# Patient Record
Sex: Female | Born: 1945
Health system: Southern US, Community
[De-identification: ages and names within clinical notes are randomized; demographics above are authoritative.]

## PROBLEM LIST (undated history)

## (undated) DIAGNOSIS — IMO0002 Reserved for concepts with insufficient information to code with codable children: Secondary | ICD-10-CM

## (undated) DIAGNOSIS — I10 Essential (primary) hypertension: Secondary | ICD-10-CM

## (undated) DIAGNOSIS — B029 Zoster without complications: Secondary | ICD-10-CM

## (undated) DIAGNOSIS — H18529 Epithelial (juvenile) corneal dystrophy, unspecified eye: Secondary | ICD-10-CM

## (undated) DIAGNOSIS — M8580 Other specified disorders of bone density and structure, unspecified site: Secondary | ICD-10-CM

## (undated) DIAGNOSIS — H1852 Epithelial (juvenile) corneal dystrophy: Secondary | ICD-10-CM

## (undated) DIAGNOSIS — K5792 Diverticulitis of intestine, part unspecified, without perforation or abscess without bleeding: Secondary | ICD-10-CM

## (undated) DIAGNOSIS — E785 Hyperlipidemia, unspecified: Secondary | ICD-10-CM

## (undated) DIAGNOSIS — L439 Lichen planus, unspecified: Secondary | ICD-10-CM

## (undated) DIAGNOSIS — K51411 Inflammatory polyps of colon with rectal bleeding: Secondary | ICD-10-CM

## (undated) DIAGNOSIS — I341 Nonrheumatic mitral (valve) prolapse: Secondary | ICD-10-CM

## (undated) DIAGNOSIS — E538 Deficiency of other specified B group vitamins: Secondary | ICD-10-CM

## (undated) DIAGNOSIS — S4292XA Fracture of left shoulder girdle, part unspecified, initial encounter for closed fracture: Secondary | ICD-10-CM

## (undated) HISTORY — DX: Nonrheumatic mitral (valve) prolapse: I34.1

## (undated) HISTORY — DX: Diverticulitis of intestine, part unspecified, without perforation or abscess without bleeding: K57.92

## (undated) HISTORY — DX: Zoster without complications: B02.9

## (undated) HISTORY — DX: Lichen planus, unspecified: L43.9

## (undated) HISTORY — DX: Deficiency of other specified B group vitamins: E53.8

## (undated) HISTORY — DX: Essential (primary) hypertension: I10

## (undated) HISTORY — DX: Other specified disorders of bone density and structure, unspecified site: M85.80

## (undated) HISTORY — DX: Epithelial (juvenile) corneal dystrophy: H18.52

## (undated) HISTORY — PX: NOSE SURGERY: SHX723

## (undated) HISTORY — DX: Hyperlipidemia, unspecified: E78.5

## (undated) HISTORY — DX: Reserved for concepts with insufficient information to code with codable children: IMO0002

## (undated) HISTORY — PX: TONSILLECTOMY AND ADENOIDECTOMY: SHX28

## (undated) HISTORY — DX: Fracture of left shoulder girdle, part unspecified, initial encounter for closed fracture: S42.92XA

## (undated) HISTORY — DX: Inflammatory polyps of colon with rectal bleeding: K51.411

## (undated) HISTORY — DX: Epithelial (juvenile) corneal dystrophy, unspecified eye: H18.529

---

## 1992-06-07 DIAGNOSIS — B029 Zoster without complications: Secondary | ICD-10-CM

## 1992-06-07 HISTORY — DX: Zoster without complications: B02.9

## 1993-01-07 DIAGNOSIS — R87619 Unspecified abnormal cytological findings in specimens from cervix uteri: Secondary | ICD-10-CM

## 1993-01-07 DIAGNOSIS — IMO0002 Reserved for concepts with insufficient information to code with codable children: Secondary | ICD-10-CM

## 1993-01-07 HISTORY — DX: Reserved for concepts with insufficient information to code with codable children: IMO0002

## 1993-01-07 HISTORY — DX: Unspecified abnormal cytological findings in specimens from cervix uteri: R87.619

## 1994-12-08 DIAGNOSIS — K51411 Inflammatory polyps of colon with rectal bleeding: Secondary | ICD-10-CM

## 1994-12-08 HISTORY — DX: Inflammatory polyps of colon with rectal bleeding: K51.411

## 1996-01-08 HISTORY — PX: SHOULDER ARTHROSCOPY: SHX128

## 1998-03-31 ENCOUNTER — Other Ambulatory Visit: Admission: RE | Admit: 1998-03-31 | Discharge: 1998-03-31 | Payer: Self-pay | Admitting: Obstetrics and Gynecology

## 1999-03-02 ENCOUNTER — Other Ambulatory Visit: Admission: RE | Admit: 1999-03-02 | Discharge: 1999-03-02 | Payer: Self-pay | Admitting: Obstetrics and Gynecology

## 1999-06-22 ENCOUNTER — Ambulatory Visit (HOSPITAL_COMMUNITY): Admission: RE | Admit: 1999-06-22 | Discharge: 1999-06-22 | Payer: Self-pay | Admitting: Gastroenterology

## 2000-02-28 ENCOUNTER — Other Ambulatory Visit: Admission: RE | Admit: 2000-02-28 | Discharge: 2000-02-28 | Payer: Self-pay | Admitting: Obstetrics and Gynecology

## 2000-10-13 ENCOUNTER — Encounter: Payer: Self-pay | Admitting: Obstetrics and Gynecology

## 2000-10-13 ENCOUNTER — Encounter: Admission: RE | Admit: 2000-10-13 | Discharge: 2000-10-13 | Payer: Self-pay | Admitting: Obstetrics and Gynecology

## 2001-03-24 ENCOUNTER — Other Ambulatory Visit: Admission: RE | Admit: 2001-03-24 | Discharge: 2001-03-24 | Payer: Self-pay | Admitting: Obstetrics and Gynecology

## 2001-11-16 ENCOUNTER — Encounter: Admission: RE | Admit: 2001-11-16 | Discharge: 2001-11-16 | Payer: Self-pay | Admitting: Orthopaedic Surgery

## 2001-11-16 ENCOUNTER — Encounter: Payer: Self-pay | Admitting: Orthopaedic Surgery

## 2002-02-01 ENCOUNTER — Emergency Department (HOSPITAL_COMMUNITY): Admission: EM | Admit: 2002-02-01 | Discharge: 2002-02-01 | Payer: Self-pay | Admitting: Emergency Medicine

## 2002-03-25 ENCOUNTER — Other Ambulatory Visit: Admission: RE | Admit: 2002-03-25 | Discharge: 2002-03-25 | Payer: Self-pay | Admitting: Obstetrics and Gynecology

## 2002-08-09 ENCOUNTER — Encounter: Payer: Self-pay | Admitting: Orthopaedic Surgery

## 2002-08-09 ENCOUNTER — Encounter: Admission: RE | Admit: 2002-08-09 | Discharge: 2002-08-09 | Payer: Self-pay | Admitting: Orthopaedic Surgery

## 2003-01-08 HISTORY — PX: TOTAL HIP ARTHROPLASTY: SHX124

## 2003-03-28 ENCOUNTER — Other Ambulatory Visit: Admission: RE | Admit: 2003-03-28 | Discharge: 2003-03-28 | Payer: Self-pay | Admitting: Obstetrics and Gynecology

## 2003-06-07 ENCOUNTER — Inpatient Hospital Stay (HOSPITAL_COMMUNITY): Admission: RE | Admit: 2003-06-07 | Discharge: 2003-06-10 | Payer: Self-pay | Admitting: Orthopaedic Surgery

## 2004-05-31 ENCOUNTER — Other Ambulatory Visit: Admission: RE | Admit: 2004-05-31 | Discharge: 2004-05-31 | Payer: Self-pay | Admitting: Obstetrics and Gynecology

## 2006-09-01 ENCOUNTER — Other Ambulatory Visit: Admission: RE | Admit: 2006-09-01 | Discharge: 2006-09-01 | Payer: Self-pay | Admitting: Obstetrics & Gynecology

## 2007-09-10 ENCOUNTER — Other Ambulatory Visit: Admission: RE | Admit: 2007-09-10 | Discharge: 2007-09-10 | Payer: Self-pay | Admitting: Obstetrics & Gynecology

## 2010-05-25 NOTE — H&P (Signed)
[name], [name]                         ACCOUNT NO.:  [id number]   MEDICAL RECORD NO.:  [id number]                   PATIENT TYPE:  INP   LOCATION:  NA                                   FACILITY:  [facility]   PHYSICIAN:  Claude Manges. Cleophas Dunker, M.D.            DATE OF BIRTH:  08/28/1945   DATE OF ADMISSION:  06/07/2003  DATE OF DISCHARGE:                                HISTORY & PHYSICAL   CHIEF COMPLAINT:  Left hip pain.   HISTORY OF PRESENT ILLNESS:  The patient is a 65 year old white female with  several-year progressively worsening left hip and back pain.  The patient  states that over the last year it has significantly worsened.  She has pain  at all times.  She says it ranges from sharp/dull to burning.  It does  worsen with any type of weightbearing activity and laying down.  She does  have night pain.  The pain is located in the posterior aspect of the hip  radiating around into the anterior groin and then down the leg to the knee.  She denies any specific mechanical symptoms.  She is currently having some  posterior back pain across the lower lumbar and iliac crest region.   X-rays show severe osteoarthritis with destruction of the hip, bone-on-bone.   ALLERGIES:  SULFA.   CURRENT MEDICATIONS:  1. Mobic 7.5 mg p.o. b.i.d. alternated with ibuprofen 600 mg p.o. b.i.d.     p.r.n.  2. Claritin 10 mg p.o. daily.   PAST MEDICAL HISTORY:  The patient denies any significant medical history  other than mitral valve prolapse and seasonal allergies.   PAST SURGICAL HISTORY:  1. Tonsillectomy.  2. Deviated septum.  3. Shoulder manipulation.   The patient denies any medial issues or complications to the above-mentioned  surgical procedures.   SOCIAL HISTORY:  This is a healthy-appearing, well-developed, thin 58-year-  old white female.  Denies any history of smoking.  She has an occasional  alcoholic beverage.  She is divorced.  She lives in a two-story house.  She  is a  Chiropractor.  Primary care physician is Dr. Talmadge Coventry,  [phone].   FAMILY MEDICAL HISTORY:  Both mother and father are deceased from lung  cancer.  The patient has got four brothers alive with a history of ETOH  abuse, otherwise in good medical health.  Four sisters alive with no medical  issues.   REVIEW OF SYSTEMS:  Positive for glasses at all times.  She does have  occasional problems with increased urinary urgency and frequency, otherwise  all other categories of the review of system categories are negative at this  time.   PHYSICAL EXAMINATION:  VITAL SIGNS:  Height is 5 feet 5, weight is 135  pounds, blood pressure is 142/78, pulse of 80 and regular, respirations 12.  The patient is afebrile.  GENERAL:  This is an elderly-appearing, thin-stature, well-developed  white  female.  She does walk with a fairly balanced gait but she is able to get  herself on and off the exam table and in and out of the chair without much  difficulty.  HEENT:  Head was normocephalic.  Pupils equal, round, and reactive.  Extraocular movements intact.  Sclera is not icteric.  External ears were  without deformities.  Gross hearing is intact.  Nasal septum was midline.  Oral buccal mucosa was pink and moist.  NECK:  Supple.  No palpable lymphadenopathy.  Thyroid region nontender.  Good range of motion.  CHEST:  Lung sounds were clear and equal bilaterally.  HEART:  Regular rate and rhythm.  No murmurs.  ABDOMEN:  Soft, nontender.  Bowel sounds were normoactive.  She had no  hepatosplenomegaly.  CVA region was nontender.  EXTREMITIES:  Upper extremities were symmetrical in size and shape.  She had  excellent range of motion to her shoulders, elbows, and wrists.  Motor  strength was 5/5.  Lower extremities:  The patient was slightly tender with  percussion along the lumbar spine and iliac crest region.  Left hip had full  extension/flexion up to 120 degrees.  She had 0 degrees internal  rotation,  10 degrees external rotation limited by mechanical and discomfort.  Right  hip had full extension, flexion up to 120 degrees, 20 degrees  internal/external rotation without mechanical symptoms or discomfort.  Bilateral knees had normal exam, as were the ankles.  PERIPHERAL VASCULAR:  Carotid pulses were 2+, no bruits.  Radial pulses were  2+.  Dorsalis pedis pulses were 2+.  She had no extremity edema or  venostasis changes.  NEUROLOGIC:  The patient was conscious, alert, and appropriate.  Easy  conversation with examiner.  Cranial nerves II-XII were grossly intact.  The  patient was  grossly intact to light touch sensation from head to toe.  She  had no gross neurologic defects noted.  BREAST/RECTAL/GENITOURINARY:  Exams were deferred at this time.   IMPRESSION:  1. End-stage osteoarthritis of the left hip.  2. Lower back pain.  3. Seasonal allergies.  4. History of mitral valve prolapse.   PLAN:  The patient will undergo all routine labs and tests prior to having a  left total hip arthroplasty by Dr. Norlene Campbell at [facility] on  May 31.  The patient has donated 2 units of autologous blood for this  procedure.      [name], P.A.                      Claude Manges. Cleophas Dunker, M.D.    RWK/MEDQ  D:  05/23/2003  T:  05/23/2003  Job:  [id number]

## 2010-05-25 NOTE — Discharge Summary (Signed)
NAMECRISTLE, [name]                         ACCOUNT NO.:  [id number]   MEDICAL RECORD NO.:  [id number]                   PATIENT TYPE:  INP   LOCATION:  5015                                 FACILITY:  [facility]   PHYSICIAN:  Claude Manges. Cleophas Dunker, M.D.            DATE OF BIRTH:  Aug 14, 1945   DATE OF ADMISSION:  06/07/2003  DATE OF DISCHARGE:  06/10/2003                                 DISCHARGE SUMMARY   ADMISSION DIAGNOSIS:  Severe osteoarthritis of left hip.   DISCHARGE DIAGNOSES:  1. Left total hip arthroplasty.  2. Postoperative blood loss anemia.  3. Anxiety postoperatively, resolved.   HISTORY OF PRESENT ILLNESS:  The patient is a 65 year old female with  several year history of progressively worsening left hip and lower back  pain. The patient states over the last year that the pain is significantly  worse and she has pain at all times. It ranges from sharp to dull to  burning. Worsened with any weight bearing activity and laying down. It does  bother her at night. The pain is located in the posterior aspect of the hip  radiating down the anterior groin and down into the knee. She denies any  mechanical symptoms. X-rays show severe osteoarthritis with destruction of  the hip and bone on bone.   ALLERGIES:  SULFA.   CURRENT MEDICATIONS:  1. Mobic 7.5 mg p.o. b.i.d.  2. Ibuprofen p.r.n.  3. Claritin 10 mg p.o. q.d.   SURGICAL PROCEDURE:  On Jun 07, 2003, the patient was taken to the OR where  a left hip total arthroplasty was performed by Dr. Norlene Campbell assisted  by Dr. Chaney Malling and Rexene Edison, P.A.-C. The patient had the procedure done  under epidural catheter. She did have an AML small stature 155 length  femoral stem, a size 52 acetabular cup, a large lipped 52-mm outside  diameter/32-mm inside diameter ___________ component, and a size 32-mm +1  off set femoral head. The patient tolerated the procedure well. There were  no complications. The patient was transferred  from the recovery room and to  the orthopedic floor in good condition where the patient was placed on  Coumadin for routine DVT prophylaxis.   The patient then incurred a total of three days postoperative care on the  orthopedic floor in which the patient did develop some slight postoperative  blood loss anemia, lightheadedness, dizziness. She was transfused autologous  blood with good improvement and resolution of her symptoms without any  complications. The patient also had some initial first day postoperative  anxiety. This was well controlled with low dose Xanax, and she had no  further issues with this throughout her hospitalization. The worked well  with physical therapy. Her vital signs remained stable. Her leg remained  neuromotor vascularly intact. Her wound remained benign. The patient was  eager to be discharged to home, and she was orthopedically and medically  stable, so she was  discharged to home on postoperative day #3 for routine  outpatient physical therapy. The patient's pain was well controlled with  Mepergan fortis upon discharge to home.   LABORATORY DATA:  EKG on admission showed normal sinus rhythm with possible  anterior septal infarct, age indeterminate at 62 beats per minute. CBC on  June 3:  WBCs 10.1, hemoglobin 9.9, hematocrit 21.1, and platelets 221. INR  on discharge was 1.5. Routine chemistries on June 2:  Sodium 137, potassium  3.5, glucose 115 down from a high of 126, BUN 5, creatinine 0.7, calcium  8.0. Routine urinalysis on admission was normal with exception of a few  epithelial cells.   The patient received 2 units of autologous blood.   MEDICATIONS UPON DISCHARGE FROM ORTHOPEDIC FLOOR:  1. Colace 100 mg p.o. b.i.d.  2. Trinsicon 1 tablet p.o. t.i.d.  3. Laxative or enema of choice p.r.n.  4. Heparin 5,000 units subcu q.12h. until Coumadin therapeutic.  5. Mepergan Fortes 1 to 2 tablets every 4 to 6 hours for pain p.r.n.  6. Tylenol 650 mg p.o.  q.6h. p.r.n.  7. Skelaxin 1 or 2 tablets every 4 to 6 hours p.r.n.  8. Restoril 15 mg p.o. q.h.s.  9. Claritin 10 mg p.o. q.d.  10.      Coumadin 5 mg p.o. q.d.   DISCHARGE INSTRUCTIONS:   MEDICATIONS:  1. Coumadin. The patient should take 5 mg half of 1 tablet a day unless     changed by home health pharmacist.  2. [name] take 1 or 2 tablets every 4 to 6 hours for pain if     needed.  3. Xanax 0.25 mg 1 tablet every 6 hours for nerves as needed.   ACTIVITY:  The patient may be partial weight bearing on the left hip with  the use of a walker and closely following hip precautions.   DIET:  No restrictions.   WOUND CARE:  The patient is to keep wound clean and dry. Check daily for any  signs of infection. If any issues, the patient is to call Dr. Hoy Register  office for advice.   FOLLOW UP:  The patient should have a followup appointment with Dr.  Cleophas Dunker approximately 10 days from discharge. The patient is to call for  this followup appointment.   CONDITION ON DISCHARGE:  The patient's condition on discharge to home is  listed as improved and good.      [name], P.A.                      Claude Manges. Cleophas Dunker, M.D.    RWK/MEDQ  D:  07/07/2003  T:  07/07/2003  Job:  [id number]

## 2010-05-25 NOTE — Procedures (Signed)
[place]  Patient:    [name], [name]                      MRN: [id number] Proc. Date: 06/22/99 Adm. Date:  [id number] Disc. Date: [id number] Attending:  Orland Mustard CC:         Dellis Anes. Idell Pickles, M.D.                           Procedure Report  PROCEDURE PERFORMED:  Colonoscopy.  ENDOSCOPIST:  Llana Aliment. Randa Evens, M.D.  MEDICATIONS USED:  Fentanyl 100 mcg, Versed 10 mg IV.  INSTRUMENT:  Adult Olympus video colonoscope.  INDICATIONS:  The patient is a 65 year old woman who had removal of a large rectal polyp with severe dysplasia.  Had a completely negative colonoscopy three years ago other than internal hemorrhoids.  This is done as a three-year follow-up.  DESCRIPTION OF PROCEDURE:  The procedure had been explained to the patient and consent obtained.  With the patient in the left lateral decubitus position, the Olympus adult video colonoscope was inserted and advanced under direct visualization.  The prep was excellent and we were able to advance to the cecum without difficulty.  The scope was withdrawn.  The cecum, ascending colon, hepatic flexure, transverse colon, splenic flexure, descending and sigmoid colon were seen well.  No polyps seen throughout the entire colon, no significant diverticular disease.  Small internal hemorrhoids seen in the rectum upon removal of the scope.  Scope withdrawn, patient tolerated the procedure well.  Maintained on low flow oxygen and pulse oximeter throughout the procedure with no obvious problem.  ASSESSMENT: 1. No evidence of colon polyps. 2. Internal hemorrhoids.  PLAN:  Will recommend repeating in five years with yearly hemoccults. DD:  06/22/99 TD:  06/26/99 Job: [id number]

## 2010-05-25 NOTE — Op Note (Signed)
[name], [name]                         ACCOUNT NO.:  [id number]   MEDICAL RECORD NO.:  [id number]                   PATIENT TYPE:  INP   LOCATION:  2899                                 FACILITY:  [facility]   PHYSICIAN:  Claude Manges. Cleophas Dunker, M.D.            DATE OF BIRTH:  [birth year]   DATE OF PROCEDURE:  06/07/2003  DATE OF DISCHARGE:                                 OPERATIVE REPORT   PREOPERATIVE DIAGNOSIS:  End stage osteoarthritis of the left hip.   POSTOPERATIVE DIAGNOSIS:  End stage osteoarthritis of the left hip.   OPERATION PERFORMED:  Left total hip replacement.   SURGEON:  Claude Manges. Cleophas Dunker, M.D.   ASSISTANT:  1. Lenard Galloway. Chaney Malling, M.D.  2. Richardean Canal, P.A.   ANESTHESIA:  Epidural/spinal.   COMPLICATIONS:  None.   COMPONENTS:  Depuy AML small stature 12 femoral component, 155 mm length, 40  mm offset.  A 32 mm outer diameter hip ball with a +1 neck, 52 mm outer  diameter Pinnacle 100 series acetabular cup with a 10 degree polyethylene  liner and an apex hole eliminator.   DESCRIPTION OF PROCEDURE:  With the patient comfortable on the operating  room and under spinal anesthetic, the nursing staff inserted a Foley  catheter.  With excellent anesthesia, the patient was then placed in the  lateral decubitus position with the left side up.  The left side was  appropriately identified as the operative site prior to the patient coming  to the operating room.  The patient was secured to the operating room table  with the Innomed hip system.  The left hip was then prepped with Betadine  scrub and then DuraPrep from the iliac crest to the  midfoot.  Sterile  draping was performed.  A routine Southern incision was utilized and by  sharp dissection, carried down to the subcutaneous tissue.  Adipose tissue  was carefully incised to the level of the iliotibial band.  Self-retaining  retractors were inserted.  The iliotibial band was then incised along the  length of  the skin incision.  With the hip internally rotated, the short  external rotators were identified and carefully incised.  Along the femoral  neck, tendinous structures were tagged with 0 Tycron suture.  The hip  capsule was then identified and carefully incised on the femoral neck and  head.  There was less than 5 mL of clear yellow joint effusion and a  moderate amount of synovitis which exuded from the capsule incision.  The  hip was then dislocated posteriorly.  The Charnley retractor was inserted.  Using the AML guide, the femoral neck was then osteotomized with an  oscillating saw.  The femoral head was delivered from the acetabulum.  The  head was devoid of articular cartilage and there were osteophytes as well as  synovitis.   Acetabulum was then explored.  There was a moderate amount of synovitis  which was debrided with the rongeur. The femur was then prepared for the  prosthesis.  A starter hole was then made in the piriformis fossa.  A canal  finder was then inserted.  Reaming was performed to 11.5 mm to accept a 12  mm prosthesis.  Sequential broaching of the femoral canal was then performed  with a 10 and then a 12 mm femoral component medial modified aspect.  The  calcar reamer was used to obtain appropriate position at about 15 to 20  degrees of anteversion.  The resection was about 1 cm proximal to the lesser  trochanter.  The acetabular retractor was then inserted.  The labrum was  sharply excised.  Reaming was performed to 51 mm outer diameter to accept a  52 mm prosthesis.  There was nice bleeding throughout the acetabulum.  We  trialed a 50 and then a 52 trial.  We felt that a 52 would be an excellent  fit.  The 52 mm outer diameter, 100 series acetabular component was then  impacted, flush with the acetabular guide.  The trial polyethylene component  was then inserted followed by the trial broach and a #1 neck length, 32 mm  outer diameter hip ball.  This was reduced,  we had excellent stability with  internal, external rotation. There was no subluxation and I felt that we had  re-established leg length equality which was about 3/8 inch shorter on the  left than the right.  The trial components were then removed.  The wound was  copiously irrigated with saline solution.  The Apex hole eliminator was  inserted followed by the final polyethylene component.  The femoral canal  was irritated with saline solution.  The femoral component was placed flush  on the calcar in about 15 to 20 degrees of anteversion.  The 32 mm outer  diameter +1 neck length hip ball was then impacted.  The entire construct  was reduced after checking the acetabulum for any loose debris.  The leg was  then placed through a full range of motion. There was no subluxation in  extension or flexion.  There was no toggling and again, I felt that the leg  lengths had been re-established.   The wound was again irrigated with saline solution.  The capsule was closed  anatomically with a #1 Ethibond.  Short external rotators were closed with  the same material.  The iliotibial band closed with running 0 Vicryl, the  subcu closed in several layers with 0 and 2-0 Vicryl.  The skin closed with  running 3-0 subcuticular Prolene with Steri-Strips.  Sterile bulky dressing  was applied followed by the Ioban.  The patient tolerated the procedure  without complications.                                               Claude Manges. Cleophas Dunker, M.D.    PWW/MEDQ  D:  06/07/2003  T:  06/07/2003  Job:  [id number]

## 2010-09-20 ENCOUNTER — Other Ambulatory Visit: Payer: Self-pay | Admitting: Gastroenterology

## 2010-09-21 ENCOUNTER — Ambulatory Visit
Admission: RE | Admit: 2010-09-21 | Discharge: 2010-09-21 | Disposition: A | Discharge status: Home / Self Care | Payer: PRIVATE HEALTH INSURANCE | Source: Ambulatory Visit | Attending: Gastroenterology | Admitting: Gastroenterology

## 2010-09-21 MED ORDER — IOHEXOL 300 MG/ML  SOLN
100.0000 mL | Freq: Once | INTRAMUSCULAR | Status: AC | PRN
  Administered 2010-09-21: 100 mL via INTRAVENOUS

## 2012-04-01 ENCOUNTER — Ambulatory Visit (INDEPENDENT_AMBULATORY_CARE_PROVIDER_SITE_OTHER): Payer: No Typology Code available for payment source | Admitting: Obstetrics and Gynecology

## 2012-04-01 ENCOUNTER — Encounter: Payer: Self-pay | Admitting: Obstetrics and Gynecology

## 2012-04-01 VITALS — BP 128/70 | Ht 65.0 in | Wt 131.0 lb

## 2012-04-01 DIAGNOSIS — IMO0002 Reserved for concepts with insufficient information to code with codable children: Secondary | ICD-10-CM | POA: Insufficient documentation

## 2012-04-01 DIAGNOSIS — N816 Rectocele: Secondary | ICD-10-CM | POA: Insufficient documentation

## 2012-04-01 DIAGNOSIS — N8110 Cystocele, unspecified: Secondary | ICD-10-CM | POA: Insufficient documentation

## 2012-04-01 DIAGNOSIS — N8111 Cystocele, midline: Secondary | ICD-10-CM

## 2012-04-01 DIAGNOSIS — N814 Uterovaginal prolapse, unspecified: Secondary | ICD-10-CM | POA: Insufficient documentation

## 2012-04-01 DIAGNOSIS — N393 Stress incontinence (female) (male): Secondary | ICD-10-CM | POA: Insufficient documentation

## 2012-04-01 NOTE — Patient Instructions (Signed)
Please refer to your handouts on prolapse and incontinence. 

## 2012-04-01 NOTE — Progress Notes (Signed)
Patient ID: [name], female   DOB: 1945-12-27, 67 y.o.   MRN: [id number]  HPI 67 year old G1P1 Caucasian female who presents with bladder prolapse and urinary frequency of 6 months duration.  Fells she is not emptying well.  Has urinary incontinence once in a while with coughing and sneezing.  Doesn't wear a protective pad.  Voids every 4 hours during the day.  Voids once per night on average.  Can be up to twice a night. Denies eneuresis.  History of UTIs.  Last infection years ago.  Denies history of pyelonephritis or nephrolithiasis.  Denies hematuria and dysuria.  Denies any difficulty with bowel movements.  Denies fecal incontinence.  Has used a pessary two to three years ago with Dr. Hyacinth Meeker.  Was uncomfortable so stopped using it.    Does not use hormone therapy.  Has lichen planus and uses clobetasole for this.    Not sexually active.  GYN HX. -  Remote history of abnormal pap and had a colposcopy without treatment.  Last pap 2011 normal.  No other pelvic procedures.  ROS - Dropped TV on self this weekend.  Bruised right thigh and finger on right hand  Pelvic Exam  Vulva without ulcerative change. Urethra - normal. Vagina - atropic.  Petechiae develop with contact with speculum. 3 degree cystocele.  1 degree uterine prolapse.  Less than 1 degree rectocele.  Uterus small, nontender, midposition.  Adnexa small and nontender.  Assessment Incomplete uterovaginal prolapse.  Failed prior pessary use. Mild genuine stress incontinence by history.  Plan -- Would do urodynamic testing prior to final surgical plan - Patient considering delay of surgery until retires in December 2015. - Patient and I discussed pelvic organ prolapse and urinary incontinence etiologies and possible treatment options.  Our discussion was general but complete regarding surgical options and materials to use.  This would need to be readdressed in the future closer to time of procedure. - Will make referral for  physical therapy. - Would do vaginal estrogen therapy for 6 weeks prior to any future surgery.

## 2012-04-03 ENCOUNTER — Telehealth: Payer: Self-pay | Admitting: *Deleted

## 2012-04-03 NOTE — Telephone Encounter (Signed)
Patient notified of date and time of appt. With Ruben Gottron, PT, 04/16/2012 @ 12:00. sue

## 2012-04-09 ENCOUNTER — Telehealth: Payer: Self-pay | Admitting: Obstetrics & Gynecology

## 2012-04-09 NOTE — Telephone Encounter (Signed)
pt wants to schedule surgery consult with dr. Erby Pian dr. Edward Jolly recently/[place]

## 2012-04-09 NOTE — Telephone Encounter (Signed)
Spoke to pt who would like to talk with Dr Hyacinth Meeker about possible surgery. Sched consult visit 04-13-12 at 1045.  aa

## 2012-04-13 ENCOUNTER — Ambulatory Visit (INDEPENDENT_AMBULATORY_CARE_PROVIDER_SITE_OTHER): Payer: No Typology Code available for payment source | Admitting: Obstetrics & Gynecology

## 2012-04-13 ENCOUNTER — Encounter: Payer: Self-pay | Admitting: Obstetrics & Gynecology

## 2012-04-13 VITALS — BP 136/78 | HR 64 | Resp 16

## 2012-04-13 DIAGNOSIS — N814 Uterovaginal prolapse, unspecified: Secondary | ICD-10-CM

## 2012-04-13 DIAGNOSIS — N8111 Cystocele, midline: Secondary | ICD-10-CM

## 2012-04-13 DIAGNOSIS — IMO0002 Reserved for concepts with insufficient information to code with codable children: Secondary | ICD-10-CM

## 2012-04-13 DIAGNOSIS — N816 Rectocele: Secondary | ICD-10-CM

## 2012-04-13 NOTE — Progress Notes (Signed)
67 year old G1P1 Caucasian female, well known to me, who has been contemplating surgery for incomplete uterine prolapse.  She has a significant cystocele, mild rectocele, and mild uterine prolapse.  She saw Dr. Edward Jolly recently who discussed surgical options with her.  She was somewhat surprised to learn she might need and benefit from a hysterectomy at the same time.  She has done a lot of thinking about this and comes with a list of questions including length of surgery, type of anesthesia, failure rates, hospital stay, recovery including do's and don'ts.  She is thinking about doing this in early January 2015.  She plans to retired December 2015 and doesn't want to wait that long.  Used a pessary in the past but with only mild success.  Not really interested in using one again.  She has already talked her her employer, [employer], and is ready to start planning.  After lengthy with patient occurred addressing all of her questions to her satisfaction.  No exam performed.  Assessment:  Incomplete uterine prolapse, 3rd degree cystocele, 1st degree rectocele, mild SUI  Plan: -LAVH/BSO with anterior repair using autologous graph.  Surgery would be done with Dr. Edward Jolly. -Encouraged pt to proceed now with pelvic PT as recommended by Dr. Edward Jolly. -Pre-operative urodynamics with Dr. Edward Jolly to ensure she does not also need a sling for treatment of her mild SUI. -Pre-treat with vaginal estrogen before surgery -Possible pre-operative consultation with anesthesia due to concerns over general.  (Patient may opt for spinal anesthesia and knows I may not be able to removed both ovaries, depending on location.) -Pre-op PUS to check ovaries.  ~35 minutes spent with patient >50% of time was in face to face discussion of above.

## 2012-04-13 NOTE — Patient Instructions (Addendum)
Has annual exam scheduled in May with Dr. Hyacinth Meeker.

## 2012-04-16 ENCOUNTER — Encounter: Payer: Self-pay | Admitting: Obstetrics & Gynecology

## 2012-05-01 ENCOUNTER — Ambulatory Visit: Payer: No Typology Code available for payment source | Admitting: Obstetrics and Gynecology

## 2012-05-13 ENCOUNTER — Ambulatory Visit: Payer: No Typology Code available for payment source | Admitting: Obstetrics & Gynecology

## 2012-05-15 ENCOUNTER — Ambulatory Visit: Payer: No Typology Code available for payment source | Admitting: Obstetrics & Gynecology

## 2012-05-28 ENCOUNTER — Encounter: Payer: Self-pay | Admitting: Certified Nurse Midwife

## 2012-05-29 ENCOUNTER — Ambulatory Visit (INDEPENDENT_AMBULATORY_CARE_PROVIDER_SITE_OTHER): Payer: No Typology Code available for payment source | Admitting: Obstetrics & Gynecology

## 2012-05-29 ENCOUNTER — Other Ambulatory Visit (HOSPITAL_COMMUNITY): Payer: Self-pay | Admitting: Orthopaedic Surgery

## 2012-05-29 ENCOUNTER — Encounter: Payer: Self-pay | Admitting: Obstetrics & Gynecology

## 2012-05-29 VITALS — BP 124/60 | Ht 65.0 in | Wt 131.8 lb

## 2012-05-29 DIAGNOSIS — M25552 Pain in left hip: Secondary | ICD-10-CM

## 2012-05-29 DIAGNOSIS — R5381 Other malaise: Secondary | ICD-10-CM

## 2012-05-29 DIAGNOSIS — Z124 Encounter for screening for malignant neoplasm of cervix: Secondary | ICD-10-CM

## 2012-05-29 DIAGNOSIS — Z0000 Encounter for general adult medical examination without abnormal findings: Secondary | ICD-10-CM

## 2012-05-29 DIAGNOSIS — R5383 Other fatigue: Secondary | ICD-10-CM

## 2012-05-29 DIAGNOSIS — Z01419 Encounter for gynecological examination (general) (routine) without abnormal findings: Secondary | ICD-10-CM

## 2012-05-29 LAB — POCT URINALYSIS DIPSTICK
Bilirubin, UA: NEGATIVE
Ketones, UA: NEGATIVE

## 2012-05-29 LAB — CBC WITH DIFFERENTIAL/PLATELET
Basophils Absolute: 0 10*3/uL (ref 0.0–0.1)
Eosinophils Absolute: 0.1 10*3/uL (ref 0.0–0.7)
Eosinophils Relative: 2 % (ref 0–5)
MCH: 30.9 pg (ref 26.0–34.0)
MCV: 88.6 fL (ref 78.0–100.0)
Monocytes Absolute: 0.7 10*3/uL (ref 0.1–1.0)
Platelets: 254 10*3/uL (ref 150–400)
RDW: 13.8 % (ref 11.5–15.5)

## 2012-05-29 LAB — COMPREHENSIVE METABOLIC PANEL
ALT: 14 U/L (ref 0–35)
AST: 23 U/L (ref 0–37)
Albumin: 4.2 g/dL (ref 3.5–5.2)
BUN: 21 mg/dL (ref 6–23)
Calcium: 9.1 mg/dL (ref 8.4–10.5)
Chloride: 107 mEq/L (ref 96–112)
Potassium: 4.5 mEq/L (ref 3.5–5.3)
Sodium: 143 mEq/L (ref 135–145)
Total Protein: 6.6 g/dL (ref 6.0–8.3)

## 2012-05-29 LAB — TSH: TSH: 2.395 u[IU]/mL (ref 0.350–4.500)

## 2012-05-29 LAB — HEMOGLOBIN, FINGERSTICK: Hemoglobin, fingerstick: 13.6 g/dL (ref 12.0–16.0)

## 2012-05-29 NOTE — Progress Notes (Signed)
67 y.o. G1P1001 DivorcedCaucasianF here for annual exam.  Reports she is having worsening fatigue over the past two months.  Feels like she wasn't eating well.  On vacation this week.  Wakes up once at night to go to the bathroom.  Feels like she sleeps well.  Sleeping about 9 hours.  Maybe she has a little shortness of breath.    Has seen Wilda and Stanton Kidney at [facility].  Reports that with pelvic exercises, her hip pain has worsened.  Feeling very motivated about doing this.  She has also seen Norlene Campbell, MD.  MRI of left hip is scheduled.  Does feel  Denies vaginal bleeding.  Still has not decided about surgery on prolapse.   More issues with lichen planus.  Now on legs and ankles.  Seeing new dermatologist at [facility].  Feels like this is under control right now.    Patient's last menstrual period was 01/07/1998.          Sexually active: no  The current method of family planning is post menopausal status.    Exercising: no  not regularly Smoker:  no  Health Maintenance: Pap:  10/05/09 WNL History of abnormal Pap:  yes MMG:  08/20/11 Diag mmg/Left breast us-screening one year Colonoscopy:  2010/endo 2012 (Dr Loreta Ave) BMD:   Followed by Dr. Clelia Croft TDaP:  2012 or 13 (with Dr. Clelia Croft) Screening Labs: 7/14, Hb today: 13.6, Urine today: WBC +1, PROTEIN trace   reports that she has never smoked. She has never used smokeless tobacco. She reports that she drinks about 1.5 ounces of alcohol per week. She reports that she does not use illicit drugs.  Past Medical History  Diagnosis Date  . Mitral valve prolapse   . Osteopenia   . Lichen planus     genital,gums,legs,back  . Shoulder fracture, left   . Shingles 6/94  . Inflammatory polyps of colon with rectal bleeding 12/96  . Abnormal Pap smear 1995    ascus    Past Surgical History  Procedure Laterality Date  . Tonsillectomy and adenoidectomy      as child  . Shoulder arthroscopy Left 1998  . Total hip arthroplasty Left 2005  .  Nose surgery      nasal repair    Current Outpatient Prescriptions  Medication Sig Dispense Refill  . Cholecalciferol (VITAMIN D PO) Take by mouth daily.      . Clobetasol Propionate (CLOBEX SPRAY) 0.05 % external spray Apply 1 spray topically 2 (two) times daily. Apply 3-4 times a week.      Marland Kitchen ketoconazole (NIZORAL) 2 % shampoo       . loratadine (CLARITIN) 10 MG tablet Take 10 mg by mouth as needed for allergies.      Marland Kitchen MELATONIN PO Take by mouth as needed.      . prednisoLONE (PRELONE) 15 MG/5ML SOLN       . tretinoin (RETIN-A) 0.1 % cream Apply topically. Every other day x 2 weeks      . betamethasone dipropionate 0.05 % lotion       . clobetasol cream (TEMOVATE) 0.05 % Apply 1 application topically as needed. Apply 3 times weekly      . temazepam (RESTORIL) 15 MG capsule Take 15 mg by mouth at bedtime as needed for sleep.       No current facility-administered medications for this visit.    Family History  Problem Relation Age of Onset  . Cancer - Colon Maternal Grandmother   .  Lung cancer Father     deceased  . Lung cancer Mother     deceased    ROS:  Pertinent items are noted in HPI.  Otherwise, a comprehensive ROS was negative.  Exam:   BP 124/60  Ht 5\' 5"  (1.651 m)  Wt 131 lb 12.8 oz (59.784 kg)  BMI 21.93 kg/m2  LMP 01/07/1998  Weight change: same   Height: 5\' 5"  (165.1 cm)  Ht Readings from Last 3 Encounters:  05/29/12 5\' 5"  (1.651 m)  04/01/12 5\' 5"  (1.651 m)    General appearance: alert, cooperative and appears stated age Head: Normocephalic, without obvious abnormality, atraumatic Neck: no adenopathy, supple, symmetrical, trachea midline and thyroid normal to inspection and palpation Lungs: clear to auscultation bilaterally Breasts: normal appearance, no masses or tenderness Heart: regular rate and rhythm Abdomen: soft, non-tender; bowel sounds normal; no masses,  no organomegaly Extremities: extremities normal, atraumatic, no cyanosis or edema Skin:  Skin color, texture, turgor normal. No rashes or lesions Lymph nodes: Cervical, supraclavicular, and axillary nodes normal. No abnormal inguinal nodes palpated Neurologic: Grossly normal   Pelvic: External genitalia:  no lesions              Urethra:  normal appearing urethra with no masses, tenderness or lesions              Bartholins and Skenes: normal                 Vagina: normal appearing vagina with normal color and discharge, no lesions, 3rd degree cystocele              Cervix: no lesions              Pap taken: yes Bimanual Exam:  Uterus:  normal size, contour, position, consistency, mobility, non-tender              Adnexa: normal adnexa and no mass, fullness, tenderness               Rectovaginal: Confirms               Anus:  normal sphincter tone, no lesions  A:  Well Woman with normal exam PMP, no HRT Fatigue, new complaint Fibromyalgia Cystocele Family hx of colon cancer Lichen planus (vulvar, mouth, lower extrimities)  P:   Mammogram yearly.  Done 8/13. Pap smear obtained today Cardiology referral will be made CBC with Diff, CMP, ferritin, TSH, ESR obtained Pt still contemplating surgery for prolapse.  Planning early Jan 2015. return annually or prn  An After Visit Summary was printed and given to the patient.

## 2012-05-29 NOTE — Patient Instructions (Signed)

## 2012-06-05 ENCOUNTER — Ambulatory Visit (HOSPITAL_COMMUNITY)
Admission: RE | Admit: 2012-06-05 | Discharge: 2012-06-05 | Disposition: A | Discharge status: Home / Self Care | Payer: PRIVATE HEALTH INSURANCE | Source: Ambulatory Visit | Attending: Orthopaedic Surgery | Admitting: Orthopaedic Surgery

## 2012-06-05 ENCOUNTER — Telehealth: Payer: Self-pay

## 2012-06-05 DIAGNOSIS — K5730 Diverticulosis of large intestine without perforation or abscess without bleeding: Secondary | ICD-10-CM | POA: Insufficient documentation

## 2012-06-05 DIAGNOSIS — M6289 Other specified disorders of muscle: Secondary | ICD-10-CM | POA: Insufficient documentation

## 2012-06-05 DIAGNOSIS — M6250 Muscle wasting and atrophy, not elsewhere classified, unspecified site: Secondary | ICD-10-CM | POA: Insufficient documentation

## 2012-06-05 DIAGNOSIS — Z96649 Presence of unspecified artificial hip joint: Secondary | ICD-10-CM | POA: Insufficient documentation

## 2012-06-05 DIAGNOSIS — M25552 Pain in left hip: Secondary | ICD-10-CM

## 2012-06-05 NOTE — Telephone Encounter (Signed)
Message copied by Elisha Headland on Fri Jun 05, 2012  8:41 AM ------      Message from: Jerene Bears      Created: Wed Jun 03, 2012  7:21 AM       Inform pt all labs were normal.  Please review them all with her.  At this point, she may need to follow-up with PCP regarding fatigue.  We can sent labs if she wants. ------

## 2012-06-05 NOTE — Telephone Encounter (Signed)
5/30 lmtcb/kn 

## 2012-06-05 NOTE — Telephone Encounter (Signed)
Patient stated that she is returning [facility] phone call from this morning.

## 2012-06-05 NOTE — Telephone Encounter (Signed)
Patient notified of lab results

## 2012-06-08 ENCOUNTER — Telehealth: Payer: Self-pay | Admitting: Orthopedic Surgery

## 2012-06-08 NOTE — Telephone Encounter (Signed)
LMTCB  aa 

## 2012-06-09 ENCOUNTER — Telehealth: Payer: Self-pay | Admitting: Orthopedic Surgery

## 2012-06-09 NOTE — Telephone Encounter (Signed)
Spoke with pt about appt with Dr. Eden Emms at [facility] 08-06-12 at 3:45. Pt agreeable.

## 2012-08-06 ENCOUNTER — Encounter: Payer: Self-pay | Admitting: Cardiovascular Disease

## 2012-08-06 ENCOUNTER — Ambulatory Visit (INDEPENDENT_AMBULATORY_CARE_PROVIDER_SITE_OTHER): Payer: PRIVATE HEALTH INSURANCE | Admitting: Cardiovascular Disease

## 2012-08-06 VITALS — BP 138/80 | HR 63 | Wt 131.0 lb

## 2012-08-06 DIAGNOSIS — R0600 Dyspnea, unspecified: Secondary | ICD-10-CM | POA: Insufficient documentation

## 2012-08-06 DIAGNOSIS — R0989 Other specified symptoms and signs involving the circulatory and respiratory systems: Secondary | ICD-10-CM

## 2012-08-06 DIAGNOSIS — R5383 Other fatigue: Secondary | ICD-10-CM

## 2012-08-06 DIAGNOSIS — I059 Rheumatic mitral valve disease, unspecified: Secondary | ICD-10-CM

## 2012-08-06 DIAGNOSIS — I341 Nonrheumatic mitral (valve) prolapse: Secondary | ICD-10-CM | POA: Insufficient documentation

## 2012-08-06 NOTE — Assessment & Plan Note (Signed)
Normal exam normal thyroid and not anemic F/U echo . Doubt cardiac etiology

## 2012-08-06 NOTE — Patient Instructions (Signed)

## 2012-08-06 NOTE — Assessment & Plan Note (Signed)
Historical DX  No murmur Echo

## 2012-08-06 NOTE — Assessment & Plan Note (Signed)
Not likely cardiac related  ECG and exam nomral Echo.  Consider f/u primary for EB/CMV titers

## 2012-08-06 NOTE — Progress Notes (Signed)
Patient ID: [name], female   DOB: July 28, 1945, 67 y.o.   MRN: [id number] referred by her ob/gyn for fatigue and dyspnea. History reports MVP but no documentation. Fatigue for a month or two.  No fever or URI no cold symptoms.  Mild exertional dyspnea that sounds functional as she is sedentary. Non smoker with no chronic lung disease.  No wheezing cough or sputum  Does have sore joints and pervious left hip surgery with mild LLE edema at end of day. No chest pain.  Labs from Dr Clelia Croft reviewed and normal including TSH  LDL 138 and improved from a year ago with diet Rx  BP high recently and patient monitoring at home   ROS: Denies fever, malais, weight loss, blurry vision, decreased visual acuity, cough, sputum, SOB, hemoptysis, pleuritic pain, palpitaitons, heartburn, abdominal pain, melena, lower extremity edema, claudication, or rash.  All other systems reviewed and negative   General: Affect appropriate Healthy:  appears stated age HEENT: normal Neck supple with no adenopathy JVP normal no bruits no thyromegaly Lungs clear with no wheezing and good diaphragmatic motion Heart:  S1/S2 no murmur,rub, gallop or click PMI normal Abdomen: benighn, BS positve, no tenderness, no AAA no bruit.  No HSM or HJR Distal pulses intact with no bruits No edema Neuro non-focal Skin warm and dry No muscular weakness  Medications Current Outpatient Prescriptions  Medication Sig Dispense Refill  . betamethasone dipropionate 0.05 % lotion as directed.       . Cholecalciferol (VITAMIN D PO) Take by mouth daily.      . Clobetasol Propionate (CLOBEX SPRAY) 0.05 % external spray Apply 1 spray topically 2 (two) times daily. Apply 3-4 times a week.      . fluticasone (FLONASE) 50 MCG/ACT nasal spray As directed      . ketoconazole (NIZORAL) 2 % shampoo       . loratadine (CLARITIN) 10 MG tablet Take 10 mg by mouth as needed for allergies.      Marland Kitchen MELATONIN PO Take by mouth as needed.      . prednisoLONE  (PRELONE) 15 MG/5ML SOLN       . tretinoin (RETIN-A) 0.1 % cream Apply topically. Every other day x 2 weeks       No current facility-administered medications for this visit.    Allergies Sulfa antibiotics; Macrobid; and Morphine and related  Family History: Family History  Problem Relation Age of Onset  . Cancer - Colon Maternal Grandmother   . Lung cancer Father     deceased  . Lung cancer Mother     deceased    Social History: History   Social History  . Marital Status: Divorced    Spouse Name: N/A    Number of Children: N/A  . Years of Education: N/A   Occupational History  . Not on file.   Social History Main Topics  . Smoking status: Never Smoker   . Smokeless tobacco: Never Used  . Alcohol Use: 1.5 oz/week    3 drink(s) per week  . Drug Use: No  . Sexually Active: No   Other Topics Concern  . Not on file   Social History Narrative  . No narrative on file    Electrocardiogram:  NSR rate 63 normal   Assessment and Plan

## 2012-08-14 ENCOUNTER — Ambulatory Visit (HOSPITAL_COMMUNITY): Payer: PRIVATE HEALTH INSURANCE | Attending: Cardiology | Admitting: Radiology

## 2012-08-14 DIAGNOSIS — R609 Edema, unspecified: Secondary | ICD-10-CM | POA: Insufficient documentation

## 2012-08-14 DIAGNOSIS — R0609 Other forms of dyspnea: Secondary | ICD-10-CM | POA: Insufficient documentation

## 2012-08-14 DIAGNOSIS — R0602 Shortness of breath: Secondary | ICD-10-CM

## 2012-08-14 DIAGNOSIS — I341 Nonrheumatic mitral (valve) prolapse: Secondary | ICD-10-CM

## 2012-08-14 DIAGNOSIS — R0989 Other specified symptoms and signs involving the circulatory and respiratory systems: Secondary | ICD-10-CM | POA: Insufficient documentation

## 2012-08-14 DIAGNOSIS — R5381 Other malaise: Secondary | ICD-10-CM | POA: Insufficient documentation

## 2012-08-14 NOTE — Progress Notes (Signed)
Echocardiogram performed.  

## 2012-08-21 ENCOUNTER — Telehealth: Payer: Self-pay | Admitting: Cardiovascular Disease

## 2012-08-21 NOTE — Telephone Encounter (Signed)
PT AWARE OF ECHO RESULTS./CY 

## 2012-08-21 NOTE — Telephone Encounter (Signed)
Follow up ° °Pt states she is returning your call.  °

## 2012-09-04 ENCOUNTER — Ambulatory Visit (INDEPENDENT_AMBULATORY_CARE_PROVIDER_SITE_OTHER): Payer: No Typology Code available for payment source | Admitting: Obstetrics & Gynecology

## 2012-09-04 ENCOUNTER — Encounter: Payer: Self-pay | Admitting: Obstetrics & Gynecology

## 2012-09-04 VITALS — BP 100/60 | HR 60 | Resp 16 | Ht 65.0 in | Wt 132.8 lb

## 2012-09-04 DIAGNOSIS — N8111 Cystocele, midline: Secondary | ICD-10-CM

## 2012-09-04 DIAGNOSIS — IMO0002 Reserved for concepts with insufficient information to code with codable children: Secondary | ICD-10-CM

## 2012-09-04 NOTE — Patient Instructions (Signed)
We will call when pessary is in office for you to pick up.

## 2012-09-04 NOTE — Progress Notes (Signed)
67 y.o. DivorcedWhite female G1P1001 here for pessary fitting.  Patient has symptomatic cystocele and has been to pelvic PT.  She is trying to avoid surgery at this time.  We have had multiple discussions over the past few years about surgery, PT, doing nothing, and pessary use.  She did use a pessary several years ago but wasn't very motivated then.  She is feeling more motivated now and would like to try again.  No vaginal bleeding or discharge.     Did go to see cardiologist.  Negative echo and nl EKG.  Contemplating sleep study with PCP.  Patient is not sexually active.  ROS:  All ROS ?s negative except as per HPI, except for continued fatigue.  Exam: BP 100/60  Pulse 60  Resp 16  Ht 5\' 5"  (1.651 m)  Wt 132 lb 12.8 oz (60.238 kg)  BMI 22.1 kg/m2  LMP 01/07/1998 General appearance: alert and cooperative Inguinal adenopathy: neg   Pelvic: External genitalia:  no lesions              Urethra: normal appearing urethra with no masses, tenderness or lesions              Bartholins and Skenes: normal                 Vagina: normal appearing vagina with normal color and discharge, no lesions, atrophic, 3rd degree cystocele present              Cervix: normal appearance Bimanual Exam:  Uterus:  uterus is normal size, shape, consistency and nontender                               Adnexa:    normal adnexa in size, nontender and no masses  Procedure:  Patient fitted with the following pessary and sizes:  2.0 incontinence ring with support.  After each fitting, patient was advised to redress, ambulate, and attempt to void.  This pessary worked well for patient.  Pessary removed.    Assessment:   Cystocele- symptomatic  Plan: Pessary will be ordered for patient and she will return to office in 2 weeks for pessary pick-up and/or placement. Information about removing and cleaning pessary was given.

## 2012-09-08 ENCOUNTER — Telehealth: Payer: Self-pay | Admitting: *Deleted

## 2012-09-08 DIAGNOSIS — N8111 Cystocele, midline: Secondary | ICD-10-CM

## 2012-09-08 NOTE — Telephone Encounter (Signed)
Patient notified that #2 pessary ring with support is now available.  She states she discussed with Dr Hyacinth Meeker that she wants to pick it up and she will call us if she has any issues at all.  Declines OV for placement.

## 2012-09-09 NOTE — Telephone Encounter (Signed)
I have it being entered, since not with insertion, I had to find a different code just for device.

## 2012-09-09 NOTE — Telephone Encounter (Signed)
That is fine.  i didn't put in a charge for the pessary.  Should i now?

## 2012-09-11 NOTE — Telephone Encounter (Signed)
Did charge get put in for pessary for this pt?  Just checking?

## 2012-09-14 NOTE — Telephone Encounter (Signed)
Yes, the charge was dropped on 9/2

## 2012-11-23 ENCOUNTER — Telehealth: Payer: Self-pay | Admitting: Obstetrics & Gynecology

## 2012-11-23 NOTE — Telephone Encounter (Signed)
Patient wants someone to call her regarding scheduling surgery.

## 2012-11-24 NOTE — Telephone Encounter (Signed)
LAVH/BSO with me.  Dr. Rica Records recommendation was anterior repair using autologous graph.  She has already seen Dr. Edward Jolly.  Please go ahead and get scheduled.  Dx: pelvic relaxation with cystocele

## 2012-11-24 NOTE — Telephone Encounter (Signed)
Patient states she is ready to proceed with surgery in January. Pessary is helping but she knows this is not a long term solution.  Brief discussion on limitations during post op period.  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  To discuss further at preop.  Would like 01-26-13 if available.  Will schedule and call patient back.  What procedure to schedule, she said cystocele repair.

## 2012-11-26 NOTE — Telephone Encounter (Signed)
Chart to insurance dept for precert.

## 2012-12-10 ENCOUNTER — Telehealth: Payer: Self-pay | Admitting: Obstetrics & Gynecology

## 2012-12-10 NOTE — Telephone Encounter (Signed)
Patient has questions about surgery  Can she climb stairs after? How many nights will she stay? Also what codes will be billed to her insurance?

## 2012-12-10 NOTE — Telephone Encounter (Signed)
Call to patient, surgery is currently posted for 01-18-13.  Patient states she is unable to have surgery on that day due to work requirements.  Prefers 02-01-13 instead of 01-25-13. Will schedule and call her back.  Surgery rescheduled to 02-01-13 at 730 at [facility]. Surgery instructions reviewed and brief discussion of post operative recovery and limitations regarding stairs, exercise, driving and overnight in hospital.  CPT codes given but cautioned that these are for procedure that we plan but could change based on operative findings.  Pre/post op appts scheduled and instruction sheet mailed. Call PRN.  Routing to provider for final review. Patient agreeable to disposition. Will close encounter

## 2012-12-11 ENCOUNTER — Telehealth: Payer: Self-pay | Admitting: Obstetrics & Gynecology

## 2012-12-11 NOTE — Telephone Encounter (Signed)
Patient is looking into changing her insurance. She wanted estimates for what the surgeon fees would be for [employer] and [place]. I gave her very generic amounts since each plan is different. Patient to call on Monday with her new insurance.

## 2012-12-11 NOTE — Telephone Encounter (Signed)
Patient is returning Carolynns call again with more questions

## 2012-12-11 NOTE — Telephone Encounter (Signed)
Patient was returning Carolynns call

## 2012-12-17 NOTE — Telephone Encounter (Signed)
Patient has decided to stay with her current insurance ([employer]). Will proceed with precerting her surgery.

## 2012-12-21 ENCOUNTER — Telehealth: Payer: Self-pay | Admitting: *Deleted

## 2012-12-21 NOTE — Telephone Encounter (Signed)
Call to patient to notify her of change in surgery date to 01-25-13.  She will check with her employer and call back if this is a problem. Will keep pre/post op appts as they are currently scheduled until patient is able to confirm with employer.

## 2012-12-21 NOTE — Telephone Encounter (Signed)
Called patient and advised of $318.75 patient coinsurance for professional fees for her surgery. Patient agrees to pay by 01.12.2015

## 2012-12-22 NOTE — Telephone Encounter (Signed)
Patient calling to see if can have her surgery on either 02-17-12 or 02-24-12?  She is having a hard time with the 01-25-13 date due to work but does not want to delay.  Called patient back and left message that I was not able to get eith of these dates and to let me know if wanted to look at 02-20-13.

## 2012-12-23 NOTE — Telephone Encounter (Signed)
Returning a call to Sally. °

## 2012-12-23 NOTE — Telephone Encounter (Signed)
LMTCB

## 2012-12-23 NOTE — Telephone Encounter (Signed)
Returning a call to [name]. °

## 2012-12-24 NOTE — Telephone Encounter (Signed)
Return call to patient. Advised unable to schedule case on requested dates in February. Patient states she will keep scheduled date of 01-25-13 but if any cancellations on 1-20, 2-3 2-9 or 02-16-13 she would love to move up. Does not want to delay further into February.  Routing to provider for final review. Patient agreeable to disposition. Will close encounter

## 2013-01-03 ENCOUNTER — Encounter: Payer: Self-pay | Admitting: Obstetrics & Gynecology

## 2013-01-05 ENCOUNTER — Telehealth: Payer: Self-pay | Admitting: *Deleted

## 2013-01-05 NOTE — Telephone Encounter (Signed)
After review with Dr Hyacinth Meeker, patient needs co-surgery with Dr Edward Jolly for bladder repair.  Has seen Dr Edward Jolly once but wanted to continue with Dr Hyacinth Meeker as primary.  States she really does not have issues with urinary leakage, just the prolapse.  Advised that Dr Hyacinth Meeker will be the primary surgeon for hysterectomy and Dr Edward Jolly will be the primary surgeon for the repair of cystocele.  Will send to Dr Edward Jolly for review, may need urodynamics, will definitely need to add preop consult with Dr Edward Jolly.  Will also check with hospital on availability for early Feb.  Advised will call her back tomorrow after review with Dr Edward Jolly and checking hospital availability on Mon 2-2 and/or 02-15-13.    Dr Edward Jolly, please advise.

## 2013-01-05 NOTE — Telephone Encounter (Signed)
Patient has a question for Kennon Rounds.(no details given)

## 2013-01-05 NOTE — Telephone Encounter (Signed)
I would be happy to do a prolapse repair for the patient.  I saw the patient many months ago and noted atrophy of her tissues, so it will be important for her to do vaginal estrogen therapy for a minimum of 4 - 6 weeks prior to surgery.      She will also need urodynamic testing with reduction of her prolapse.  Please set up an appointment for me to see her again soon so I can prescribe the estrogen, re-examine her, and discuss the urodynamics.  Thanks!

## 2013-01-05 NOTE — Telephone Encounter (Signed)
Patient had several questions regarding dates for surgery and preop appointment. Wanted to see if any cancellations for early February other options for preop appointment with Dr Hyacinth Meeker.  No other options that met patient's needs so appointments left as scheduled.  Patient wanted to speak to anesthesiologist at preop. Advised that PAT nurse will call her to schedule this and she should request to be sure she is scheduled to [facility] anesthesiologist at preop appt.    Routing to provider for final review. Patient agreeable to disposition. Will close encounter

## 2013-01-06 NOTE — Telephone Encounter (Signed)
Surgery rescheduled to 02-15-13 at 0730 at [facility]. Call to patient and advised of date change. She is happy with new date.Advised needs appointment with Dr Edward Jolly ASAP to plan next steps and determine need for vaginal estrogen.  Consult scheduled for 01-11-13.  Canceled preop with Dr Hyacinth Meeker for now, will reschedule closer to surgery. Patient will stop by my office after appointment with Dr Edward Jolly on Monday.

## 2013-01-11 ENCOUNTER — Ambulatory Visit (INDEPENDENT_AMBULATORY_CARE_PROVIDER_SITE_OTHER): Payer: No Typology Code available for payment source | Admitting: Obstetrics and Gynecology

## 2013-01-11 ENCOUNTER — Encounter: Payer: Self-pay | Admitting: Obstetrics and Gynecology

## 2013-01-11 VITALS — BP 138/78 | HR 64 | Ht 65.0 in | Wt 131.0 lb

## 2013-01-11 DIAGNOSIS — N812 Incomplete uterovaginal prolapse: Secondary | ICD-10-CM

## 2013-01-11 MED ORDER — ESTROGENS, CONJUGATED 0.625 MG/GM VA CREA: 1.0000 | TOPICAL_CREAM | Freq: Every day | VAGINAL | Status: DC

## 2013-01-11 NOTE — Patient Instructions (Addendum)
Conjugated Estrogens vaginal cream What is this medicine? CONJUGATED ESTROGENS (CON ju gate ed ESS troe jenz) are a mixture of female hormones. This cream can help relieve symptoms associated with menopause.like vaginal dryness and irritation. This medicine may be used for other purposes; ask your health care provider or pharmacist if you have questions. COMMON BRAND NAME(S): Premarin What should I tell my health care provider before I take this medicine? They need to know if you have any of these conditions: -abnormal vaginal bleeding -blood vessel disease or blood clots -breast, cervical, endometrial, or uterine cancer -dementia -diabetes -gallbladder disease -heart disease or recent heart attack -high blood pressure -high cholesterol -high level of calcium in the blood -hysterectomy -kidney disease -liver disease -migraine headaches -protein C deficiency -protein S deficiency -stroke -systemic lupus erythematosus (SLE) -tobacco smoker -an unusual or allergic reaction to estrogens other medicines, foods, dyes, or preservatives -pregnant or trying to get pregnant -breast-feeding How should I use this medicine? This medicine is for use in the vagina only. Do not take by mouth. Follow the directions on the prescription label. Use at bedtime unless otherwise directed by your doctor or health care professional. Use the special applicator supplied with the cream. Wash hands before and after use. Fill the applicator with the cream and remove from the tube. Lie on your back, part and bend your knees. Insert the applicator into the vagina and push the plunger to expel the cream into the vagina. Wash the applicator with warm soapy water and rinse well. Use exactly as directed for the complete length of time prescribed. Do not stop using except on the advice of your doctor or health care professional. Talk to your pediatrician regarding the use of this medicine in children. Special care may be  needed. A patient package insert for the product will be given with each prescription and refill. Read this sheet carefully each time. The sheet may change frequently. Overdosage: If you think you have taken too much of this medicine contact a poison control center or emergency room at once. NOTE: This medicine is only for you. Do not share this medicine with others. What if I miss a dose? If you miss a dose, use it as soon as you can. If it is almost time for your next dose, use only that dose. Do not use double or extra doses. What may interact with this medicine? Do not take this medicine with any of the following medications: -aromatase inhibitors like aminoglutethimide, anastrozole, exemestane, letrozole, testolactone This medicine may also interact with the following medications: -barbiturates used for inducing sleep or treating seizures -carbamazepine -grapefruit juice -medicines for fungal infections like itraconazole and ketoconazole -raloxifene or tamoxifen -rifabutin -rifampin -rifapentine -ritonavir -some antibiotics used to treat infections -St. John's Wort -warfarin This list may not describe all possible interactions. Give your health care provider a list of all the medicines, herbs, non-prescription drugs, or dietary supplements you use. Also tell them if you smoke, drink alcohol, or use illegal drugs. Some items may interact with your medicine. What should I watch for while using this medicine? Visit your health care professional for regular checks on your progress. You will need a regular breast and pelvic exam. You should also discuss the need for regular mammograms with your health care professional, and follow his or her guidelines. This medicine can make your body retain fluid, making your fingers, hands, or ankles swell. Your blood pressure can go up. Contact your doctor or health care professional if you  feel you are retaining fluid. If you have any reason to think  you are pregnant; stop taking this medicine at once and contact your doctor or health care professional. Tobacco smoking increases the risk of getting a blood clot or having a stroke, especially if you are more than 68 years old. You are strongly advised not to smoke. If you wear contact lenses and notice visual changes, or if the lenses begin to feel uncomfortable, consult your eye care specialist. If you are going to have elective surgery, you may need to stop taking this medicine beforehand. Consult your health care professional for advice prior to scheduling the surgery. What side effects may I notice from receiving this medicine? Side effects that you should report to your doctor or health care professional as soon as possible: -allergic reactions like skin rash, itching or hives, swelling of the face, lips, or tongue -breast tissue changes or discharge -changes in vision -chest pain -confusion, trouble speaking or understanding -dark urine -general ill feeling or flu-like symptoms -light-colored stools -nausea, vomiting -pain, swelling, warmth in the leg -right upper belly pain -severe headaches -shortness of breath -sudden numbness or weakness of the face, arm or leg -trouble walking, dizziness, loss of balance or coordination -unusual vaginal bleeding -yellowing of the eyes or skin Side effects that usually do not require medical attention (report to your doctor or health care professional if they continue or are bothersome): -hair loss -increased hunger or thirst -increased urination -symptoms of vaginal infection like itching, irritation or unusual discharge -unusually weak or tired This list may not describe all possible side effects. Call your doctor for medical advice about side effects. You may report side effects to FDA at 1-800-FDA-1088. Where should I keep my medicine? Keep out of the reach of children. Store at room temperature between 15 and 30 degrees C (59 and 86  degrees F). Throw away any unused medicine after the expiration date. NOTE: This sheet is a summary. It may not cover all possible information. If you have questions about this medicine, talk to your doctor, pharmacist, or health care provider.  2014, [employer]vier/Gold Standard. (2010-03-28 09:20:36)  

## 2013-01-11 NOTE — Progress Notes (Signed)
Patient ID: [name], female   DOB: [birth year], 68 y.o.   MRN: [id number] GYNECOLOGY PROBLEM VISIT  PCP:   Janalyn Rouse, MD  Referring provider:   HPI: 68 y.o.   Divorced  Caucasian  female   G1P1001 with Patient's last menstrual period was 01/07/1998.   here for   Uterine prolapse.  Patient interested in surgery. Had physical therapy and was fitted for a pessary by Dr. Sabra Heck. This pessary is working better than in the past.  Not leaking urine. Has leakage once a year with cough, laugh. No leak with exercise, but does not exercise often other than gardening.  Some infrequent urgency episodes, but again this is not often.  Night time voiding once. No enuresis.  One vaginal delivery.  No prior bladder or pelvic surgery.   Used OCPs in past without problems. Used Activella.   Some episodes of elevated blood pressure and mild visual changes.  140s/90s. Saw PCP and decision not to start antiHTN medication yet.  Has a history of lichen planus, seems to be worse when stressed.  Patient had a "reaction" followng hip surgery. Attributes this to morphine use. Had anxiety attacks.  GYNECOLOGIC HISTORY: Patient's last menstrual period was 01/07/1998. Sexually active:  no Partner preference: female Contraception: postmenopausal   Menopausal hormone therapy: no DES exposure:   no Blood transfusions:  no  Sexually transmitted diseases:   no GYN Procedures:  none Mammo:  Normal at [facility] in August 2014.  History of abnormal pap smear:  Hx of slight squamous atypia in 1990's Pap normal in May 2014.    OB History   Grav Para Term Preterm Abortions TAB SAB Ect Mult Living   1 1 1       1          Family History  Problem Relation Age of Onset  . Cancer - Colon Maternal Grandmother   . Lung cancer Father     deceased  . Lung cancer Mother     deceased    Patient Active Problem List   Diagnosis Date Noted  . Dyspnea 08/06/2012  . Fatigue 08/06/2012  . MVP (mitral  valve prolapse) 08/06/2012  . Cystocele 04/01/2012  . Rectocele 04/01/2012  . Uterine prolapse 04/01/2012  . Genuine stress incontinence, female 04/01/2012    Past Medical History  Diagnosis Date  . Mitral valve prolapse   . Osteopenia   . Lichen planus     genital,gums,legs,back  . Shoulder fracture, left   . Shingles 6/94  . Inflammatory polyps of colon with rectal bleeding 12/96  . Abnormal Pap smear 1995    ascus  . Hypertension     ? being tested    Past Surgical History  Procedure Laterality Date  . Tonsillectomy and adenoidectomy      as child  . Shoulder arthroscopy Left 1998  . Total hip arthroplasty Left 2005  . Nose surgery      nasal repair    ALLERGIES: Sulfa antibiotics; Aleve; Macrobid; and Morphine and related  Current Outpatient Prescriptions  Medication Sig Dispense Refill  . betamethasone dipropionate 0.05 % lotion as directed.       . Cholecalciferol (VITAMIN D PO) Take 2,000 Int'l Units by mouth daily.       . Clobetasol Propionate (CLOBEX SPRAY) 0.05 % external spray Apply 1 spray topically 2 (two) times daily. Apply 3-4 times a week.      . fluticasone (FLONASE) 50 MCG/ACT nasal spray As directed      .  ketoconazole (NIZORAL) 2 % shampoo       . loratadine (CLARITIN) 10 MG tablet Take 10 mg by mouth as needed for allergies.      . prednisoLONE (PRELONE) 15 MG/5ML SOLN        No current facility-administered medications for this visit.     ROS:  Pertinent items are noted in HPI.  PHYSICAL EXAMINATION:    BP 138/78  Pulse 64  Ht 5\' 5"  (1.651 m)  Wt 131 lb (59.421 kg)  BMI 21.80 kg/m2  LMP 01/07/1998   Wt Readings from Last 3 Encounters:  01/11/13 131 lb (59.421 kg)  09/04/12 132 lb 12.8 oz (60.238 kg)  08/06/12 131 lb (59.421 kg)     Ht Readings from Last 3 Encounters:  01/11/13 5\' 5"  (1.651 m)  09/04/12 5\' 5"  (1.651 m)  05/29/12 5\' 5"  (1.651 m)    General appearance: alert, cooperative and appears stated age  Neurologic:  Grossly normal  Pelvic: External genitalia:  no lesions              Urethra:  normal appearing urethra with no masses, tenderness or lesions              Bartholins and Skenes: normal                 Vagina: normal appearing vagina with normal color and discharge, no lesions.  Second degree cystocele, minimal rectocele.              Cervix: normal appearance.  First degree uterine prolapse.                 Bimanual Exam:  Uterus:  uterus is normal size, shape, consistency and nontender                                      Adnexa: normal adnexa in size, nontender and no masses                                      Rectovaginal: Confirms                                      Anus:  normal sphincter tone, no lesions  ASSESSMENT  Incomplete uterovaginal prolapse. No stress incontinence. Pessary patient.  PLAN  No urodynamic testing needed as no midurethral sling is planned for the patient.   Premarin vagina cream 1/2 gm at hs for 2 weeks.  Then 1/2 gram at hs twice a week.  Will proceed with likely a laparoscopically assisted vaginal hysterectomy and bilateral salpingo-oophorectomy with Dr. Sabra Heck and then an anterior and posterior colporrhaphy with possible Xenform bovine graft and possible bilateral sacrospinous fixation for vaginal vault suspension under my direction.  I have had a comprehensive discussion with the patient regarding prolapse.  I have provided reading materials from ACOG regarding prolapse in general as well as medical and surgical treatment for these conditions.   We discussed benefits and risks which include but are not limited to bleeding, infection, damage to surrounding organs, ureteral damage, vaginal pain with intercourse, graft erosion and exposure, dyspareunia, urinary retention, reoperation, recurrence of prolapse, peripheral neuropathy, hernia formation, DVT, PE, death, and reaction to anesthesia.    She indicates  understanding or her surgical procedure, outcome,  and recovery.  She agrees to proceed.   Return for preop visit.     An After Visit Summary was printed and given to the patient.  25 minutes face to face time of which over 50% was spent in counseling.

## 2013-01-12 ENCOUNTER — Institutional Professional Consult (permissible substitution): Payer: No Typology Code available for payment source | Admitting: Obstetrics & Gynecology

## 2013-01-14 ENCOUNTER — Telehealth: Payer: Self-pay | Admitting: Obstetrics & Gynecology

## 2013-01-14 NOTE — Telephone Encounter (Signed)
Patient calling to cancel her upcoming surgery with Dr. Sabra Heck. She states she is, "concerned about the risks."

## 2013-01-15 NOTE — Telephone Encounter (Signed)
Return call to patient. She states that after consult with Dr Quincy Simmonds on Monday, she has reconsidered everything and has decided that she does not want to proceed since there is the possibility that this would not help her.  She also reports concerns about anesthesia.  She says she was willing to have surgery to "improve quality of life " and  Does not want to take a chance that surgery could make it worse. She has decided she would just like to "live with it" for now.  Has sent a question to Dr Sabra Heck thru [place] and if she has anymore questions after that she will call back. Advised to call back if decides symptoms are bothersome enough to have treatment.

## 2013-01-15 NOTE — Telephone Encounter (Signed)
Thank you for the update!

## 2013-01-21 ENCOUNTER — Telehealth: Payer: Self-pay | Admitting: Obstetrics & Gynecology

## 2013-01-21 NOTE — Telephone Encounter (Signed)
Patient calling wanting to know if it is okay if she uses premarin cream with pessary. She has not filled rx and not using rx at this time but wanted to know if using premarin cream when replacing pessary "will help my condition." Advised would send message to Dr. Sabra Heck (patient requests Dr. Sabra Heck only) and that I would call her back with response. Patient is agreeable.

## 2013-01-21 NOTE — Telephone Encounter (Signed)
Patient calling wanting to speak with Claiborne Billings and stating Claiborne Billings told her to call her any time. The patient declined to give any details about why she is calling as it is a "sensitive matter." Routing call to triage as instructed.

## 2013-02-01 NOTE — Telephone Encounter (Signed)
Spoke with patient personally and answered all questions about premarin use.  She has decided not to use it at this time and I am fine with this.    I discussed her recently planned surgery and decisions to cancel surgery.  She is just not sure about what she wants at this time so has decided to put this on hold.  Advised pt, I am totally comfortable with her decision and will just wait and watch along with her.    Encounter closed.

## 2013-02-04 ENCOUNTER — Telehealth: Payer: Self-pay | Admitting: Obstetrics & Gynecology

## 2013-02-04 NOTE — Telephone Encounter (Signed)
Surgery was canceled, I must not have taken the post op appts out.  Routing to provider for final review. Patient agreeable to disposition. Will close encounter

## 2013-02-04 NOTE — Telephone Encounter (Signed)
Patient indicated at Reminder Call she wanted to cancel her appointment with Dr. Sabra Heck for a post op 02/08/13. I called and spoke with the patient and she stated she cancelled the surgery so there should not be a need for any post op appointments. She wanted to make sure Gay Filler was aware the post op appointments have now been cancelled.

## 2013-02-08 ENCOUNTER — Ambulatory Visit: Payer: No Typology Code available for payment source | Admitting: Obstetrics & Gynecology

## 2013-02-15 ENCOUNTER — Ambulatory Visit (HOSPITAL_COMMUNITY)
Admission: RE | Admit: 2013-02-15 | Payer: PRIVATE HEALTH INSURANCE | Source: Ambulatory Visit | Admitting: Obstetrics & Gynecology

## 2013-02-15 ENCOUNTER — Encounter (HOSPITAL_COMMUNITY): Admission: RE | Payer: Self-pay | Source: Ambulatory Visit

## 2013-02-15 SURGERY — HYSTERECTOMY, VAGINAL, LAPAROSCOPY-ASSISTED
Anesthesia: General

## 2013-03-04 ENCOUNTER — Ambulatory Visit: Payer: No Typology Code available for payment source | Admitting: Obstetrics & Gynecology

## 2013-06-24 ENCOUNTER — Ambulatory Visit (INDEPENDENT_AMBULATORY_CARE_PROVIDER_SITE_OTHER): Payer: No Typology Code available for payment source | Admitting: Obstetrics & Gynecology

## 2013-06-24 ENCOUNTER — Encounter: Payer: Self-pay | Admitting: Obstetrics & Gynecology

## 2013-06-24 VITALS — BP 100/74 | HR 78 | Resp 12 | Ht 65.25 in | Wt 135.0 lb

## 2013-06-24 DIAGNOSIS — Z01419 Encounter for gynecological examination (general) (routine) without abnormal findings: Secondary | ICD-10-CM

## 2013-06-24 DIAGNOSIS — Z0000 Encounter for general adult medical examination without abnormal findings: Secondary | ICD-10-CM

## 2013-06-24 LAB — POCT URINALYSIS DIPSTICK
PH UA: 5
Urobilinogen, UA: NEGATIVE

## 2013-06-24 NOTE — Progress Notes (Signed)
68 y.o. G63P1001 Divorced Caucasian F here for annual exam.  No vaginal bleeding.  Doing well.  Just finished a busy month of trips that involved a lot of driving.  Drove 2600 miles in May.    Seeing Dr. Brigitte Pulse next month.  She will check about when her colonoscopy is due.  She thinks this fall.  Labs will be done with him next week.  Biggest issue for her is lichen planus.  Sees dentist every six months.  Using mouth wash regularly.    Patient's last menstrual period was 01/07/1998.          Sexually active: no  The current method of family planning is post menopausal status.    Exercising: no  The patient does not participate in regular exercise at present. Smoker:  no  Health Maintenance: Pap: 05/29/12 NEG HR HPV History of abnormal Pap:  Yes 1/90 ; 12/90 colpo bx mild dysplasia  MMG:  09/04/12 Bi-Rads 1 Colonoscopy:  2010 Normal, due every 5 years.  Dr. Oletta Lamas.  BMD:   2014   TDaP:  Within 10 years, with PCP Screening Labs: Marton Redwood, MD , Hb today: no, Urine today: WBC +   reports that she has never smoked. She has never used smokeless tobacco. She reports that she drinks about 1 - 1.5 ounces of alcohol per week. She reports that she does not use illicit drugs.  Past Medical History  Diagnosis Date  . Mitral valve prolapse   . Osteopenia   . Lichen planus     genital,gums,legs,back  . Shoulder fracture, left   . Shingles 6/94  . Inflammatory polyps of colon with rectal bleeding 12/96  . Abnormal Pap smear 1995    ascus  . Hypertension     ? being tested    Past Surgical History  Procedure Laterality Date  . Tonsillectomy and adenoidectomy      as child  . Shoulder arthroscopy Left 1998  . Total hip arthroplasty Left 2005  . Nose surgery      nasal repair    Current Outpatient Prescriptions  Medication Sig Dispense Refill  . betamethasone dipropionate 0.05 % lotion as directed.       . Cholecalciferol (VITAMIN D PO) Take 2,000 Int'l Units by mouth daily.       .  Clobetasol Propionate (CLOBEX SPRAY) 0.05 % external spray Apply 1 spray topically 2 (two) times daily. Apply 3-4 times a week.      . conjugated estrogens (PREMARIN) vaginal cream Place 1 Applicatorful vaginally daily. Use 1/2 g vaginally every night at bed time for the first 2 weeks, then use 1/2 g vaginally two time a week as needed to maintain symptom relief.  60 g  3  . fluticasone (FLONASE) 50 MCG/ACT nasal spray As directed      . ketoconazole (NIZORAL) 2 % shampoo       . loratadine (CLARITIN) 10 MG tablet Take 10 mg by mouth as needed for allergies.      . prednisoLONE (PRELONE) 15 MG/5ML SOLN        No current facility-administered medications for this visit.    Family History  Problem Relation Age of Onset  . Cancer - Colon Maternal Grandmother   . Lung cancer Father     deceased  . Lung cancer Mother     deceased    ROS:  Pertinent items are noted in HPI.  Otherwise, a comprehensive ROS was negative.  Exam:   Ht 5'  5.25" (1.657 m)  Wt 135 lb (61.236 kg)  BMI 22.30 kg/m2  LMP 01/07/1998  Weight change: +4#s  Height: 5' 5.25" (165.7 cm)  Ht Readings from Last 3 Encounters:  06/24/13 5' 5.25" (1.657 m)  01/11/13 5\' 5"  (1.651 m)  09/04/12 5\' 5"  (1.651 m)    General appearance: alert, cooperative and appears stated age Head: Normocephalic, without obvious abnormality, atraumatic Neck: no adenopathy, supple, symmetrical, trachea midline and thyroid normal to inspection and palpation Lungs: clear to auscultation bilaterally Breasts: normal appearance, no masses or tenderness Heart: regular rate and rhythm Abdomen: soft, non-tender; bowel sounds normal; no masses,  no organomegaly Extremities: extremities normal, atraumatic, no cyanosis or edema Skin: Skin color, texture, turgor normal. No rashes or lesions Lymph nodes: Cervical, supraclavicular, and axillary nodes normal. No abnormal inguinal nodes palpated Neurologic: Grossly normal   Pelvic: External genitalia:   no lesions              Urethra:  normal appearing urethra with no masses, tenderness or lesions              Bartholins and Skenes: normal                 Vagina: normal appearing vagina with normal color and discharge, no lesions, 3rd degree cystocele              Cervix: no lesions              Pap taken: yes Bimanual Exam:  Uterus:  normal size, contour, position, consistency, mobility, non-tender              Adnexa: normal adnexa and no mass, fullness, tenderness               Rectovaginal: Confirms               Anus:  normal sphincter tone, no lesions  A:  Well Woman with normal exam  PMP, no HRT  Fatigue, new complaint.  Retiring end of the year--12/31 is hopeful date!! Fibromyalgia  Cystocele .  Has decided for the time being now to proceed with surgery.  Off estrogen cream.  Using pessary infrequently.   Family hx of colon cancer.  I think colonoscopy due this year.  She will check with Dr. Brigitte Pulse to be sure at her next appt. Lichen planus (vulvar, mouth, lower extrimities)   P: Mammogram yearly. Done 8/14.  Did 3D.  Pap 2014.  No pap today.    return annually or prn  An After Visit Summary was printed and given to the patient.

## 2013-10-22 ENCOUNTER — Other Ambulatory Visit: Payer: Self-pay

## 2013-11-08 ENCOUNTER — Encounter: Payer: Self-pay | Admitting: Obstetrics & Gynecology

## 2014-03-04 DIAGNOSIS — J343 Hypertrophy of nasal turbinates: Secondary | ICD-10-CM | POA: Diagnosis not present

## 2014-03-04 DIAGNOSIS — M950 Acquired deformity of nose: Secondary | ICD-10-CM | POA: Diagnosis not present

## 2014-03-04 DIAGNOSIS — J342 Deviated nasal septum: Secondary | ICD-10-CM | POA: Diagnosis not present

## 2014-03-30 ENCOUNTER — Telehealth: Payer: Self-pay | Admitting: Obstetrics & Gynecology

## 2014-03-30 NOTE — Telephone Encounter (Signed)
Left patient a message to call back to reschedule her AEX with Dr. Sabra Heck from 07/12/14.

## 2014-04-08 HISTORY — PX: COLONOSCOPY: SHX174

## 2014-04-26 DIAGNOSIS — D120 Benign neoplasm of cecum: Secondary | ICD-10-CM | POA: Diagnosis not present

## 2014-04-26 DIAGNOSIS — Z86010 Personal history of colonic polyps: Secondary | ICD-10-CM | POA: Diagnosis not present

## 2014-04-26 DIAGNOSIS — D126 Benign neoplasm of colon, unspecified: Secondary | ICD-10-CM | POA: Diagnosis not present

## 2014-04-26 DIAGNOSIS — K5730 Diverticulosis of large intestine without perforation or abscess without bleeding: Secondary | ICD-10-CM | POA: Diagnosis not present

## 2014-04-26 DIAGNOSIS — K641 Second degree hemorrhoids: Secondary | ICD-10-CM | POA: Diagnosis not present

## 2014-04-26 DIAGNOSIS — D123 Benign neoplasm of transverse colon: Secondary | ICD-10-CM | POA: Diagnosis not present

## 2014-04-26 DIAGNOSIS — Z09 Encounter for follow-up examination after completed treatment for conditions other than malignant neoplasm: Secondary | ICD-10-CM | POA: Diagnosis not present

## 2014-06-28 DIAGNOSIS — J301 Allergic rhinitis due to pollen: Secondary | ICD-10-CM | POA: Diagnosis not present

## 2014-06-28 DIAGNOSIS — H1045 Other chronic allergic conjunctivitis: Secondary | ICD-10-CM | POA: Diagnosis not present

## 2014-06-28 DIAGNOSIS — J3089 Other allergic rhinitis: Secondary | ICD-10-CM | POA: Diagnosis not present

## 2014-06-28 DIAGNOSIS — J3081 Allergic rhinitis due to animal (cat) (dog) hair and dander: Secondary | ICD-10-CM | POA: Diagnosis not present

## 2014-06-29 ENCOUNTER — Telehealth: Payer: Self-pay | Admitting: Obstetrics & Gynecology

## 2014-06-29 NOTE — Telephone Encounter (Signed)
Left message on voicemail to call and reschedule cancelled appointment. °

## 2014-07-04 ENCOUNTER — Other Ambulatory Visit: Payer: Self-pay

## 2014-07-12 ENCOUNTER — Ambulatory Visit: Payer: No Typology Code available for payment source | Admitting: Obstetrics & Gynecology

## 2014-07-13 ENCOUNTER — Ambulatory Visit: Payer: No Typology Code available for payment source | Admitting: Obstetrics & Gynecology

## 2014-07-19 DIAGNOSIS — H5203 Hypermetropia, bilateral: Secondary | ICD-10-CM | POA: Diagnosis not present

## 2014-07-19 DIAGNOSIS — H52223 Regular astigmatism, bilateral: Secondary | ICD-10-CM | POA: Diagnosis not present

## 2014-07-19 DIAGNOSIS — H35373 Puckering of macula, bilateral: Secondary | ICD-10-CM | POA: Diagnosis not present

## 2014-07-19 DIAGNOSIS — Z6820 Body mass index (BMI) 20.0-20.9, adult: Secondary | ICD-10-CM | POA: Diagnosis not present

## 2014-07-19 DIAGNOSIS — H25813 Combined forms of age-related cataract, bilateral: Secondary | ICD-10-CM | POA: Diagnosis not present

## 2014-07-19 DIAGNOSIS — H524 Presbyopia: Secondary | ICD-10-CM | POA: Diagnosis not present

## 2014-07-19 DIAGNOSIS — K5792 Diverticulitis of intestine, part unspecified, without perforation or abscess without bleeding: Secondary | ICD-10-CM | POA: Diagnosis not present

## 2014-08-02 ENCOUNTER — Ambulatory Visit (INDEPENDENT_AMBULATORY_CARE_PROVIDER_SITE_OTHER): Payer: Medicare Other | Admitting: Obstetrics & Gynecology

## 2014-08-02 ENCOUNTER — Encounter: Payer: Self-pay | Admitting: Obstetrics & Gynecology

## 2014-08-02 VITALS — BP 120/60 | HR 72 | Resp 16 | Ht 64.75 in | Wt 128.0 lb

## 2014-08-02 DIAGNOSIS — Z01419 Encounter for gynecological examination (general) (routine) without abnormal findings: Secondary | ICD-10-CM | POA: Diagnosis not present

## 2014-08-02 DIAGNOSIS — Z124 Encounter for screening for malignant neoplasm of cervix: Secondary | ICD-10-CM | POA: Diagnosis not present

## 2014-08-02 DIAGNOSIS — E785 Hyperlipidemia, unspecified: Secondary | ICD-10-CM | POA: Diagnosis not present

## 2014-08-02 DIAGNOSIS — M859 Disorder of bone density and structure, unspecified: Secondary | ICD-10-CM | POA: Diagnosis not present

## 2014-08-02 DIAGNOSIS — I10 Essential (primary) hypertension: Secondary | ICD-10-CM | POA: Diagnosis not present

## 2014-08-02 DIAGNOSIS — M8580 Other specified disorders of bone density and structure, unspecified site: Secondary | ICD-10-CM | POA: Diagnosis not present

## 2014-08-02 DIAGNOSIS — Z0000 Encounter for general adult medical examination without abnormal findings: Secondary | ICD-10-CM | POA: Diagnosis not present

## 2014-08-02 NOTE — Progress Notes (Signed)
69 y.o. G1P1001 DivorcedCaucasianF here for annual exam.  Doing well.  Reports she had a diverticulitis "attack" and is on last day of antibiotics.  No vaginal bleeding.  Not using pessary these last two weeks due to the diverticulitis.  Pt plan to start using it again.  Reports bladder prolapse seems about the same.  Pt did retire.  So pleased with this.  [employer] and pt staying it touch.    Saw allergist last year.  Dr. Fredderick Phenix.  Will see yearly.  No on any allergy shots.  Had complaint of fatigue last year but reports this is much better.    PCP:  Dr. Brigitte Pulse.  Labs done this morning.  Has appt next week.   Patient's last menstrual period was 01/07/1998.          Sexually active: No.  The current method of family planning is post menopausal status.    Exercising: No.  The patient does not participate in regular exercise at present. Smoker:  no  Health Maintenance: Pap:  05/29/12 neg History of abnormal Pap:  Yes, mild dysplasia 1990's  MMG:  08/2012 BIRADS1:Neg Colonoscopy:  04/2014 Polyps - every 5 years  BMD:   2014 TDaP:  UTD w/ PCP Screening Labs: PCP, Hb today: PCP, Urine today: PCP   reports that she has never smoked. She has never used smokeless tobacco. She reports that she drinks about 1.0 - 1.5 oz of alcohol per week. She reports that she does not use illicit drugs.  Past Medical History  Diagnosis Date  . Mitral valve prolapse   . Osteopenia   . Lichen planus     genital,gums,legs,back  . Shoulder fracture, left   . Shingles 6/94  . Inflammatory polyps of colon with rectal bleeding 12/96  . Abnormal Pap smear 1995    [facility]  . Hypertension     ? being tested  . Diverticulitis     Past Surgical History  Procedure Laterality Date  . Tonsillectomy and adenoidectomy      as child  . Shoulder arthroscopy Left 1998  . Total hip arthroplasty Left 2005  . Nose surgery      nasal repair  . Colonoscopy  04/2014    Polyps - repeat 5 years - Dr. Oletta Lamas      Current Outpatient Prescriptions  Medication Sig Dispense Refill  . betamethasone dipropionate 0.05 % lotion as directed.     . Cholecalciferol (VITAMIN D PO) Take 2,000 Int'l Units by mouth daily.     . ciprofloxacin (CIPRO) 500 MG tablet TAKE 1 TAB BY MOUTH TWICE DAILY FOR 14 DAYS  0  . Clobetasol Propionate (CLOBEX SPRAY) 0.05 % external spray Apply 1 spray topically 2 (two) times daily. Apply 3-4 times a week.    Marland Kitchen ketoconazole (NIZORAL) 2 % shampoo     . metroNIDAZOLE (FLAGYL) 500 MG tablet TAKE 1 TAB BY MOUTH EVERY 8 HOURS FOR 14 DAYS  0  . mometasone (NASONEX) 50 MCG/ACT nasal spray     . olopatadine (PATANOL) 0.1 % ophthalmic solution INSTILL 1 DROP TO AFFECTED EYES TWICE DAILY AS NEEDED.  6  . prednisoLONE (PRELONE) 15 MG/5ML SOLN      No current facility-administered medications for this visit.    Family History  Problem Relation Age of Onset  . Cancer - Colon Maternal Grandmother   . Lung cancer Father     deceased  . Lung cancer Mother     deceased    ROS:  Pertinent items are noted in HPI.  Otherwise, a comprehensive ROS was negative.  Exam:   BP 120/60 mmHg  Pulse 72  Resp 16  Ht 5' 4.75" (1.645 m)  Wt 128 lb (58.06 kg)  BMI 21.46 kg/m2  LMP 01/07/1998  Weight change: @WEIGHTCHANGE @ Height:   Height: 5' 4.75" (164.5 cm)  Ht Readings from Last 3 Encounters:  08/02/14 5' 4.75" (1.645 m)  06/24/13 5' 5.25" (1.657 m)  01/11/13 5\' 5"  (1.651 m)    General appearance: alert, cooperative and appears stated age Head: Normocephalic, without obvious abnormality, atraumatic Neck: no adenopathy, supple, symmetrical, trachea midline and thyroid normal to inspection and palpation Lungs: clear to auscultation bilaterally Breasts: normal appearance, no masses or tenderness Heart: regular rate and rhythm Abdomen: soft, non-tender; bowel sounds normal; no masses,  no organomegaly Extremities: extremities normal, atraumatic, no cyanosis or edema Skin: Skin color,  texture, turgor normal.  About six small bruised appearing area, not raised, nontender Lymph nodes: Cervical, supraclavicular, and axillary nodes normal. No abnormal inguinal nodes palpated Neurologic: Grossly normal   Pelvic: External genitalia:  no lesions              Urethra:  normal appearing urethra with no masses, tenderness or lesions              Bartholins and Skenes: normal                 Vagina: normal appearing vagina with normal color and discharge, no lesions, 4th degree cystocele              Cervix: no lesions              Pap taken: Yes.   Bimanual Exam:  Uterus:  normal size, contour, position, consistency, mobility, non-tender              Adnexa: normal adnexa and no mass, fullness, tenderness               Rectovaginal: Confirms               Anus:  normal sphincter tone, no lesions  Chaperone was present for exam.  A:  Well Woman with normal exam  PMP, no HRT  Fibromyalgia  Th degree cystocele with pessary use.  Pt continues to desire no surgery  Family hx of colon cancer. Colonoscopy 2016. Lichen planus (vulvar, mouth, lower extrimities)  Six or seven purpura-like lesions on inner arms--drug related?  P: Mammogram guidelines reviewed.  Pt doing every other year.   Pap 2014. Pap today.   Labs with Dr. Brigitte Pulse yearly.  Just had done today.   Pt will stop antibiotics today.  Has less than 24 hours of dosing left.  Will monitor and call if worsens.  Is seeing Dr. Brigitte Pulse next week.  Will notify his nurse of this finding for him to make note of next week at exam.   Return annually or prn

## 2014-08-02 NOTE — Patient Instructions (Signed)
Make sure to call and schedule your mammogram

## 2014-08-03 LAB — IPS PAP SMEAR ONLY

## 2014-08-17 DIAGNOSIS — Z6821 Body mass index (BMI) 21.0-21.9, adult: Secondary | ICD-10-CM | POA: Diagnosis not present

## 2014-08-17 DIAGNOSIS — Z8719 Personal history of other diseases of the digestive system: Secondary | ICD-10-CM | POA: Diagnosis not present

## 2014-08-17 DIAGNOSIS — M8580 Other specified disorders of bone density and structure, unspecified site: Secondary | ICD-10-CM | POA: Diagnosis not present

## 2014-08-17 DIAGNOSIS — Z86010 Personal history of colonic polyps: Secondary | ICD-10-CM | POA: Diagnosis not present

## 2014-08-17 DIAGNOSIS — I10 Essential (primary) hypertension: Secondary | ICD-10-CM | POA: Diagnosis not present

## 2014-08-17 DIAGNOSIS — Z0000 Encounter for general adult medical examination without abnormal findings: Secondary | ICD-10-CM | POA: Diagnosis not present

## 2014-08-17 DIAGNOSIS — E785 Hyperlipidemia, unspecified: Secondary | ICD-10-CM | POA: Diagnosis not present

## 2014-08-17 DIAGNOSIS — M19041 Primary osteoarthritis, right hand: Secondary | ICD-10-CM | POA: Diagnosis not present

## 2014-08-17 DIAGNOSIS — M797 Fibromyalgia: Secondary | ICD-10-CM | POA: Diagnosis not present

## 2014-08-18 DIAGNOSIS — Z1231 Encounter for screening mammogram for malignant neoplasm of breast: Secondary | ICD-10-CM | POA: Diagnosis not present

## 2014-09-05 DIAGNOSIS — M859 Disorder of bone density and structure, unspecified: Secondary | ICD-10-CM | POA: Diagnosis not present

## 2014-10-15 DIAGNOSIS — Z23 Encounter for immunization: Secondary | ICD-10-CM | POA: Diagnosis not present

## 2015-01-30 DIAGNOSIS — M25511 Pain in right shoulder: Secondary | ICD-10-CM | POA: Diagnosis not present

## 2015-01-30 DIAGNOSIS — M25552 Pain in left hip: Secondary | ICD-10-CM | POA: Diagnosis not present

## 2015-01-30 DIAGNOSIS — M7531 Calcific tendinitis of right shoulder: Secondary | ICD-10-CM | POA: Diagnosis not present

## 2015-04-11 DIAGNOSIS — H52223 Regular astigmatism, bilateral: Secondary | ICD-10-CM | POA: Diagnosis not present

## 2015-04-11 DIAGNOSIS — H25812 Combined forms of age-related cataract, left eye: Secondary | ICD-10-CM | POA: Diagnosis not present

## 2015-04-11 DIAGNOSIS — H25011 Cortical age-related cataract, right eye: Secondary | ICD-10-CM | POA: Diagnosis not present

## 2015-04-11 DIAGNOSIS — H524 Presbyopia: Secondary | ICD-10-CM | POA: Diagnosis not present

## 2015-04-11 DIAGNOSIS — H5203 Hypermetropia, bilateral: Secondary | ICD-10-CM | POA: Diagnosis not present

## 2015-06-29 DIAGNOSIS — L433 Subacute (active) lichen planus: Secondary | ICD-10-CM | POA: Diagnosis not present

## 2015-06-29 DIAGNOSIS — L720 Epidermal cyst: Secondary | ICD-10-CM | POA: Diagnosis not present

## 2015-06-29 DIAGNOSIS — L659 Nonscarring hair loss, unspecified: Secondary | ICD-10-CM | POA: Diagnosis not present

## 2015-08-16 DIAGNOSIS — M859 Disorder of bone density and structure, unspecified: Secondary | ICD-10-CM | POA: Diagnosis not present

## 2015-08-16 DIAGNOSIS — E784 Other hyperlipidemia: Secondary | ICD-10-CM | POA: Diagnosis not present

## 2015-08-16 DIAGNOSIS — I10 Essential (primary) hypertension: Secondary | ICD-10-CM | POA: Diagnosis not present

## 2015-08-23 DIAGNOSIS — Z6820 Body mass index (BMI) 20.0-20.9, adult: Secondary | ICD-10-CM | POA: Diagnosis not present

## 2015-08-23 DIAGNOSIS — I10 Essential (primary) hypertension: Secondary | ICD-10-CM | POA: Diagnosis not present

## 2015-08-23 DIAGNOSIS — L659 Nonscarring hair loss, unspecified: Secondary | ICD-10-CM | POA: Diagnosis not present

## 2015-08-23 DIAGNOSIS — E784 Other hyperlipidemia: Secondary | ICD-10-CM | POA: Diagnosis not present

## 2015-08-23 DIAGNOSIS — Z0000 Encounter for general adult medical examination without abnormal findings: Secondary | ICD-10-CM | POA: Diagnosis not present

## 2015-08-23 DIAGNOSIS — M19041 Primary osteoarthritis, right hand: Secondary | ICD-10-CM | POA: Diagnosis not present

## 2015-08-23 DIAGNOSIS — B351 Tinea unguium: Secondary | ICD-10-CM | POA: Diagnosis not present

## 2015-08-23 DIAGNOSIS — M859 Disorder of bone density and structure, unspecified: Secondary | ICD-10-CM | POA: Diagnosis not present

## 2015-08-23 DIAGNOSIS — M25519 Pain in unspecified shoulder: Secondary | ICD-10-CM | POA: Diagnosis not present

## 2015-08-23 DIAGNOSIS — Z1212 Encounter for screening for malignant neoplasm of rectum: Secondary | ICD-10-CM | POA: Diagnosis not present

## 2015-08-23 DIAGNOSIS — Z86010 Personal history of colonic polyps: Secondary | ICD-10-CM | POA: Diagnosis not present

## 2015-08-23 DIAGNOSIS — R400 Somnolence: Secondary | ICD-10-CM | POA: Diagnosis not present

## 2015-08-24 DIAGNOSIS — Z803 Family history of malignant neoplasm of breast: Secondary | ICD-10-CM | POA: Diagnosis not present

## 2015-08-24 DIAGNOSIS — Z1231 Encounter for screening mammogram for malignant neoplasm of breast: Secondary | ICD-10-CM | POA: Diagnosis not present

## 2015-08-30 DIAGNOSIS — M25511 Pain in right shoulder: Secondary | ICD-10-CM | POA: Diagnosis not present

## 2015-08-31 ENCOUNTER — Ambulatory Visit (INDEPENDENT_AMBULATORY_CARE_PROVIDER_SITE_OTHER): Payer: Medicare Other | Admitting: Neurology

## 2015-08-31 ENCOUNTER — Encounter: Payer: Self-pay | Admitting: Neurology

## 2015-08-31 ENCOUNTER — Telehealth: Payer: Self-pay

## 2015-08-31 VITALS — BP 132/70 | HR 70 | Resp 14 | Ht 65.0 in | Wt 123.0 lb

## 2015-08-31 DIAGNOSIS — R351 Nocturia: Secondary | ICD-10-CM | POA: Diagnosis not present

## 2015-08-31 DIAGNOSIS — R0981 Nasal congestion: Secondary | ICD-10-CM | POA: Diagnosis not present

## 2015-08-31 DIAGNOSIS — R519 Headache, unspecified: Secondary | ICD-10-CM

## 2015-08-31 DIAGNOSIS — G4710 Hypersomnia, unspecified: Secondary | ICD-10-CM

## 2015-08-31 DIAGNOSIS — R51 Headache: Secondary | ICD-10-CM | POA: Diagnosis not present

## 2015-08-31 NOTE — Patient Instructions (Signed)

## 2015-08-31 NOTE — Progress Notes (Signed)
Subjective:    Patient ID: [name] is a 70 y.o. female.  HPI     Star Age, MD, PhD [facility] [address], [address]O. [place], [place] [address]  Dear Dr. Brigitte Pulse,  I saw your patient, [name], upon your kind request in my neurologic clinic today for initial consultation of her sleep disorder, in particular, concern for underlying obstructive sleep apnea. The patient is unaccompanied today. As you know, [name] is a 69 year old right-handed woman with an underlying medical history of hypertension, hyperlipidemia, mitral valve prolapse, diverticulitis, osteopenia, arthritis, status post left total hip replacement in 2005, who reports nonrestorative sleep, sleep disruption and daytime somnolence. She lives alone, she is not sure if she snores, but may as she wakes up with a dry mouth almost every morning. She is divorced and has one grown son. She is retired. She does not smoke, drinks alcohol occasionally denies illicit drug use and drinks caffeine in the form of coffee.  She is retired from Veterinary surgeon work for years.  She denies any significant restless leg symptoms. She has rare morning headaches. She has not sure he about once per night on average. I reviewed your office note from 08/23/2015, which you kindly included. She has chronic nasal congestion, uses breathe right strips, and has seen ENT for this. She was given a Rx for nasonex, but has not used it consistently.  She likes to sleep on her stomach with head turned to the L shoulder.   She has tried melatonin OTC, stopped taking it about 6 month ago.  Her Epworth sleepiness score is 8 out of 24 today, her fatigue score is 50 out of 63. She she naps occasionally, there is no family history of OSA. She is one of 9 siblings altogether, none of her siblings have any noteworthy sleep disorder. She had septoplasty many years ago in her 57s and some 10 years ago she saw an ENT who offered  her nasal surgery to open up her nasal passages but she opted not to proceed with surgery, her current ENT is not in favor of surgery.  Her Past Medical History Is Significant For: Past Medical History:  Diagnosis Date  . Abnormal Pap smear 1995   [facility]  . Diverticulitis   . Hyperlipidemia   . Hypertension    ? being tested  . Inflammatory polyps of colon with rectal bleeding ([place]) 12/96  . Lichen planus    genital,gums,legs,back  . Mitral valve prolapse   . Osteopenia   . Shingles 6/94  . Shoulder fracture, left     Her Past Surgical History Is Significant For: Past Surgical History:  Procedure Laterality Date  . COLONOSCOPY  04/2014   Polyps - repeat 5 years - Dr. Oletta Lamas   . NOSE SURGERY     nasal repair  . SHOULDER ARTHROSCOPY Left 1998  . TONSILLECTOMY AND ADENOIDECTOMY     as child  . TOTAL HIP ARTHROPLASTY Left 2005    Her Family History Is Significant For: Family History  Problem Relation Age of Onset  . Lung cancer Mother     deceased  . Lung cancer Father     deceased  . Cancer - Colon Maternal Grandmother   . Prostate cancer Brother     Her Social History Is Significant For: Social History   Social History  . Marital status: Divorced    Spouse name: [name]  . Number of children: 1  . Years of education:  college   Occupational History  . Retired     Social History Main Topics  . Smoking status: Never Smoker  . Smokeless tobacco: Never Used  . Alcohol use 1.0 - 1.5 oz/week    2 - 3 Standard drinks or equivalent per week  . Drug use: No  . Sexual activity: No   Other Topics Concern  . None   Social History Narrative   Drinks 1-2 cups of coffee a day     Her Allergies Are:  Allergies  Allergen Reactions  . Sulfa Antibiotics Other (See Comments)    Tight jaw  . Aleve [Naproxen Sodium]     Stomach bleeding  . Macrobid [Nitrofurantoin Macrocrystal]     Stomach cramps  . Morphine And Related Anxiety  :   Her Current Medications Are:   Outpatient Encounter Prescriptions as of 08/31/2015  Medication Sig  . betamethasone dipropionate 0.05 % lotion as directed.   . Biotin 5000 MCG TABS Take by mouth.  . cetirizine (ZYRTEC) 10 MG tablet Take 10 mg by mouth daily.  . Cholecalciferol (VITAMIN D3) 2000 units TABS Take by mouth.  . diclofenac sodium (VOLTAREN) 1 % GEL Apply topically 4 (four) times daily.  . meloxicam (MOBIC) 15 MG tablet   . mometasone (NASONEX) 50 MCG/ACT nasal spray   . olopatadine (PATANOL) 0.1 % ophthalmic solution INSTILL 1 DROP TO AFFECTED EYES TWICE DAILY AS NEEDED.  Marland Kitchen prednisoLONE (PRELONE) 15 MG/5ML SOLN Take by mouth 2 (two) times daily.   Marland Kitchen tretinoin (RETIN-A) 0.05 % cream Apply topically at bedtime.  . [DISCONTINUED] cetirizine (ZYRTEC) 10 MG chewable tablet Chew 10 mg by mouth daily.  . [DISCONTINUED] diclofenac sodium (VOLTAREN) 1 % GEL Apply topically 4 (four) times daily.  . [DISCONTINUED] Melatonin 3 MG CAPS Take by mouth.   No facility-administered encounter medications on file as of 08/31/2015.   :  Review of Systems:  Out of a complete 14 point review of systems, all are reviewed and negative with the exception of these symptoms as listed below:  Review of Systems  Neurological:       Patient has trouble falling and staying asleep, has dry mouth at night, uses breathe right strips, daytime tiredness, takes naps daily  Epworth Sleepiness Scale 0= would never doze 1= slight chance of dozing 2= moderate chance of dozing 3= high chance of dozing  Sitting and reading:1 Watching TV:0 Sitting inactive in a public place (ex. Theater or meeting):0 As a passenger in a car for an hour without a break:2 Lying down to rest in the afternoon:3 Sitting and talking to someone:0 Sitting quietly after lunch (no alcohol):2 In a car, while stopped in traffic:0 Total:8  Objective:  Neurologic Exam  Physical Exam Physical Examination:   Vitals:   08/31/15 1332  BP: 132/70  Pulse: 70  Resp:  14    General Examination: The patient is a very pleasant 70 y.o. female in no acute distress. She appears well-developed and well-nourished and well groomed.   HEENT: Normocephalic, atraumatic, pupils are equal, round and reactive to light and accommodation. Funduscopic exam is normal with sharp disc margins noted. Extraocular tracking is good without limitation to gaze excursion or nystagmus noted. Normal smooth pursuit is noted. Hearing is grossly intact. Tympanic membranes are clear bilaterally. Face is symmetric with normal facial animation and normal facial sensation. Speech is clear with no dysarthria noted. There is no hypophonia. There is no lip, neck/head, jaw or voice tremor. Neck is supple with full  range of passive and active motion. There are no carotid bruits on auscultation. Oropharynx exam reveals: mild mouth dryness, good dental hygiene and moderate airway crowding, due to small airway and redundant soft palate, wider uvula, no significant residual tonsillar tissue. Mallampati is class I. Tongue protrudes centrally and palate elevates symmetrically. Tonsils are absent. Neck size is 13.25 inches. She has a nearly absent overbite. Nasal inspection reveals smaller nasal passages, right smaller than left.    Chest: Clear to auscultation without wheezing, rhonchi or crackles noted.  Heart: S1+S2+0, regular and normal without murmurs, rubs or gallops noted.   Abdomen: Soft, non-tender and non-distended with normal bowel sounds appreciated on auscultation.  Extremities: There is no pitting edema in the distal lower extremities bilaterally. Pedal pulses are intact.  Skin: Warm and dry without trophic changes noted. There are no varicose veins.  Musculoskeletal: exam reveals no obvious joint deformities, tenderness or joint swelling or erythema.   Neurologically:  Mental status: The patient is awake, alert and oriented in all 4 spheres. Her immediate and remote memory, attention,  language skills and fund of knowledge are appropriate. There is no evidence of aphasia, agnosia, apraxia or anomia. Speech is clear with normal prosody and enunciation. Thought process is linear. Mood is normal and affect is normal.  Cranial nerves II - XII are as described above under HEENT exam. In addition: shoulder shrug is normal with equal shoulder height noted. Motor exam: Normal bulk, strength and tone is noted.reflexes are 2+ throughout, Romberg negative. fine motor skills and coordination: intact with normal finger taps, normal hand movements, normal rapid alternating patting, normal foot taps and normal foot agility.  Cerebellar testing: No dysmetria or intention tremor on finger to nose testing. Heel to shin is unremarkable bilaterally. There is no truncal or gait ataxia.  Sensory exam: intact to light touch, pinprick, vibration, temperature sense in the upper and lower extremities.  Gait, station and balance: She stands easily. No veering to one side is noted. No leaning to one side is noted. Posture is age-appropriate and stance is narrow based. Gait shows normal stride length and  pace. No problems turning are noted. Tandem walk is unremarkable.   Assessment and Plan:   In summary, KIRRA HEATER is a very pleasant 70 y.o.-year old female with an underlying medical history of hypertension, hyperlipidemia, mitral valve prolapse, diverticulitis, osteopenia, arthritis, status post left total hip replacement in 2005, whose history and physical exam are concerning for obstructive sleep apnea (OSA). I had a long chat with the patient about my findings and the diagnosis of OSA, its prognosis and treatment options. We talked about medical treatments, surgical interventions and non-pharmacological approaches. I explained in particular the risks and ramifications of untreated moderate to severe OSA, especially with respect to developing cardiovascular disease down the Road, including congestive heart  failure, difficult to treat hypertension, cardiac arrhythmias, or stroke. Even type 2 diabetes has, in part, been linked to untreated OSA. Symptoms of untreated OSA include daytime sleepiness, memory problems, mood irritability and mood disorder such as depression and anxiety, lack of energy, as well as recurrent headaches, especially morning headaches. We talked about trying to maintain a healthy lifestyle in general, as well as the importance of weight control. I encouraged the patient to eat healthy, exercise daily and keep well hydrated, to keep a scheduled bedtime and wake time routine, to not skip any meals and eat healthy snacks in between meals. I advised the patient not to drive when feeling sleepy.  I recommended the following at this time: sleep study with potential positive airway pressure titration. (We will score hypopneas at 4% and split the sleep study into diagnostic and treatment portion, if the estimated. 2 hour AHI is >15/h).   I explained the sleep test procedure to the patient and also outlined possible surgical and non-surgical treatment options of OSA, including the use of a custom-made dental device (which would require a referral to a specialist dentist or oral surgeon), upper airway surgical options, such as pillar implants, radiofrequency surgery, tongue base surgery, and UPPP (which would involve a referral to an ENT surgeon). Rarely, jaw surgery such as mandibular advancement may be considered.  I also explained the CPAP treatment option to the patient, who indicated that she would be willing to try CPAP if the need arises. I explained the importance of being compliant with PAP treatment, not only for insurance purposes but primarily to improve Her symptoms, and for the patient's long term health benefit, including to reduce Her cardiovascular risks. I answered all her questions today and the patient was in agreement. I would like to see her back after the sleep study is completed  and encouraged her to call with any interim questions, concerns, problems or updates.   Thank you very much for allowing me to participate in the care of this nice patient. If I can be of any further assistance to you please do not hesitate to call me at [phone].  Sincerely,   Star Age, MD, PhD

## 2015-08-31 NOTE — Telephone Encounter (Signed)
Patient returned [name]'s call, will keep 1:30pm appointment time.

## 2015-08-31 NOTE — Telephone Encounter (Signed)
I called and left patient a message to call [name] back. I wanted to see if we can move her appt from 1:30 to 2:30 due to Dr. Rexene Alberts being in a meeting. I stated that if she can't move it that is fine too.

## 2015-09-12 ENCOUNTER — Encounter: Payer: Self-pay | Admitting: Obstetrics & Gynecology

## 2015-09-26 ENCOUNTER — Ambulatory Visit (INDEPENDENT_AMBULATORY_CARE_PROVIDER_SITE_OTHER): Payer: Medicare Other | Admitting: Neurology

## 2015-09-26 DIAGNOSIS — G4720 Circadian rhythm sleep disorder, unspecified type: Secondary | ICD-10-CM

## 2015-09-26 DIAGNOSIS — G4710 Hypersomnia, unspecified: Secondary | ICD-10-CM | POA: Diagnosis not present

## 2015-09-26 DIAGNOSIS — G4761 Periodic limb movement disorder: Secondary | ICD-10-CM

## 2015-09-27 ENCOUNTER — Encounter: Payer: Self-pay | Admitting: *Deleted

## 2015-10-02 ENCOUNTER — Telehealth: Payer: Self-pay | Admitting: Neurology

## 2015-10-02 NOTE — Progress Notes (Signed)
PATIENT'S NAME:  [name], [name] DOB:      07-19-1945      MR#:    [id number]     DATE OF RECORDING: 09/26/2015 REFERRING M.D.:  Assunta Curtis, MD Study Performed:   Baseline Polysomnogram HISTORY:  70 year old woman with an underlying medical history of hypertension, hyperlipidemia, mitral valve prolapse, diverticulitis, osteopenia, arthritis, status post left total hip replacement in 2005, who reports non-restorative sleep, sleep disruption and daytime somnolence.  The patient endorsed the Epworth Sleepiness Scale at 8/24 points.    The patient's weight 123 pounds with a height of 65 (inches), resulting in a BMI of 20.6 kg/m2.  The patient's neck circumference measured 13.2 inches.  CURRENT MEDICATIONS: Betamethasone Dipropionate, Biotin, Zyrtec, Vitamin D, Voltaren, Mobic, Nasonex, Patanol, Prelone, Retin-A.   PROCEDURE:  This is a multichannel digital polysomnogram utilizing the Somnostar 11.2 system.  Electrodes and sensors were applied and monitored per AASM Specifications.   EEG, EOG, Chin and Limb EMG, were sampled at 200 Hz.  ECG, Snore and Nasal Pressure, Thermal Airflow, Respiratory Effort, CPAP Flow and Pressure, Oximetry was sampled at 50 Hz. Digital video and audio were recorded.      BASELINE STUDY  Lights Out was at 21:29 and Lights On at 05:06.  Total recording time (TRT) was 457.5, with a total sleep time (TST) of 345.5 minutes.   The patient's sleep latency was 106.5 minutes.  REM latency was 203 minutes.  The sleep efficiency was 75.5 %, mildly reduced.     SLEEP ARCHITECTURE: Wake after sleep onset was 76.5 minutes, with moderate sleep fragmentation noted, Stage N1 11%, which is increased, Stage N2 47.8%, Stage N3 18.4% and Stage R (REM sleep) 22.9%.   RESPIRATORY ANALYSIS:  There was a total of 8 respiratory events:  7 obstructive apneas, 0 central apneas and 0 mixed apneas with a total of 7 apneas and an apnea index (AI) of 1.2. There were 1 hypopneas with a hypopnea index  of .2. The patient also had 0 respiratory event related arousals (RERAs).   Mild intermittent snoring was noted.     The total APNEA/HYPOPNEA INDEX (AHI) was 1.4 and the total RESPIRATORY DISTURBANCE INDEX was 1.4.  1 events occurred in REM sleep and 2 events in NREM. The REM AHI was .8, versus a non-REM AHI of 1.6. The patient spent 92% of total sleep time in the supine position. The supine AHI was 1.3 versus a non-supine AHI of 2.1.  OXYGEN SATURATION & C02:  The baseline 02 saturation was 94%, with the lowest being 88%. Time spent below 89% saturation equaled 0 minutes.   PERIODIC LIMB MOVEMENTS:   The patient had a total of 275 Periodic Limb Movements.  The Periodic Limb Movement (PLM) index was 47.8 and the PLM Arousal index was 3.5.   IMPRESSION:  1. Periodic Limb Movement Disorder  2. Dysfunctions associated with sleep stages or arousal from sleep   RECOMMENDATIONS:  1.  This study did not demonstrate any significant sleep disordered breathing.  2.  Moderate PLMs were noted with minimal arousals; clinical correlation is recommended.  3.  This study showed sleep fragmentation and mildly abnormal sleep stage percentages; this is a non-specific finding.  4.  The patient will be seen in follow up in the Sleep Clinic at [facility]. Her referring physician will be notified of the test results.      I certify that I have reviewed the entire raw data recording prior to the issuance of this  report in accordance with the Standards of Accreditation of the American Academy of Sleep Medicine (AASM)       Star Age, MD, PhD Diplomat, American Board of Psychiatry and Neurology  Diplomat, American Board of Sleep Medicine

## 2015-10-02 NOTE — Telephone Encounter (Signed)
Patient referred by Dr. Brigitte Pulse, seen by me on 08/31/15, diagnostic PSG on 09/26/15.   Please call and notify the patient that the recent sleep study did not show any significant obstructive sleep apnea, but she had moderate PLMs. Please inform patient that I would like to go over the details of the study during a follow up appointment. Arrange a followup appointment. Also, route or fax report to PCP and referring MD, if other than PCP.  Once you have spoken to patient, you can close this encounter.   Thanks,  Star Age, MD, PhD [facility])

## 2015-10-03 NOTE — Telephone Encounter (Signed)
I spoke to patient and she is aware of results and recommendations. We were able to schedule f/u appt. I will call patient back if something sooner opens. Report sent to PCP

## 2015-10-07 DIAGNOSIS — Z23 Encounter for immunization: Secondary | ICD-10-CM | POA: Diagnosis not present

## 2015-10-09 NOTE — Telephone Encounter (Signed)
I called patient and was able to move her appt back to a later time per patient request.

## 2015-10-18 ENCOUNTER — Ambulatory Visit: Payer: Self-pay | Admitting: Neurology

## 2015-10-18 ENCOUNTER — Ambulatory Visit (INDEPENDENT_AMBULATORY_CARE_PROVIDER_SITE_OTHER): Payer: Medicare Other | Admitting: Neurology

## 2015-10-18 ENCOUNTER — Encounter: Payer: Self-pay | Admitting: Neurology

## 2015-10-18 VITALS — BP 110/62 | HR 70 | Resp 14 | Ht 65.0 in | Wt 124.0 lb

## 2015-10-18 DIAGNOSIS — G4761 Periodic limb movement disorder: Secondary | ICD-10-CM | POA: Diagnosis not present

## 2015-10-18 DIAGNOSIS — G479 Sleep disorder, unspecified: Secondary | ICD-10-CM | POA: Diagnosis not present

## 2015-10-18 DIAGNOSIS — R0981 Nasal congestion: Secondary | ICD-10-CM | POA: Diagnosis not present

## 2015-10-18 NOTE — Progress Notes (Signed)
Subjective:    Patient ID: [name] is a 70 y.o. female.  HPI     Interim history:   [name] is a 70 year old right-handed woman with an underlying medical history of hypertension, hyperlipidemia, mitral valve prolapse, diverticulitis, osteopenia, arthritis, status post left total hip replacement in 2005, who presents for follow-up consultation of her sleep disturbance, after her recent sleep study. The patient is unaccompanied today. I first met her on 08/31/2015 at the request of her primary care physician, at which time she reported sleep disruption, daytime somnolence, nonrestorative sleep and waking up with a dry mouth. I invited her for sleep study. She had a baseline sleep study on 09/26/2015. I went over her test results with her in detail today. Sleep efficiency was 75.5%, sleep latency was prolonged at 106.5 minutes. REM latency was also prolonged at 203 minutes. She had an increased percentage of stage I sleep, a normal percentage of stage II sleep, a normal percentage of slow-wave sleep, and a normal percentage of REM sleep. She had a total AHI of 1.4 per hour which is in the normal range. She slept mostly in her supine position. She had intermittent mild snoring. Average oxygen saturation was 94%, nadir was 88%. She had moderatePLMS with an index of 47.8 per hour, but the associated arousal index was minimally increased at 3.5 per hour.  Today, 10/18/2015: She reports no new issues with the exception of intermittent swallowing difficulty she has noted. She denies any overt reflux symptoms, she does have postnasal drip but denies waking up with cough. She does snore intermittently and in the past tried an over-the-counter device called breathe with EEZ which is a tiny soft wire cage that you insert into the nostrils. She brought her pair with her today. She does have small nasal passages and when inserted into the nasal passage it does open up her nasal pathway just a little bit  more. She has tried melatonin in the past and is considering trying it again. She is relieved that she did not have any significant sleep apnea.  Previously:  08/31/2015: She reports nonrestorative sleep, sleep disruption and daytime somnolence. She lives alone, she is not sure if she snores, but may as she wakes up with a dry mouth almost every morning. She is divorced and has one grown son. She is retired. She does not smoke, drinks alcohol occasionally denies illicit drug use and drinks caffeine in the form of coffee.  She is retired from Veterinary surgeon work for years.  She denies any significant restless leg symptoms. She has rare morning headaches. She has nocturia about once per night on average. I reviewed your office note from 08/23/2015, which you kindly included. She has chronic nasal congestion, uses breathe right strips, and has seen ENT for this. She was given a Rx for nasonex, but has not used it consistently.  She likes to sleep on her stomach with head turned to the L shoulder.   She has tried melatonin OTC, stopped taking it about 6 month ago.  Her Epworth sleepiness score is 8 out of 24 today, her fatigue score is 50 out of 63. She she naps occasionally, there is no family history of OSA. She is one of 9 siblings altogether, none of her siblings have any noteworthy sleep disorder. She had septoplasty many years ago in her 20s and some 10 years ago she saw an ENT who offered her nasal surgery to open up her nasal passages but she opted not  to proceed with surgery, her current ENT is not in favor of surgery.  Her Past Medical History Is Significant For: Past Medical History:  Diagnosis Date  . Abnormal Pap smear 1995   [place]  . Diverticulitis   . Hyperlipidemia   . Hypertension    ? being tested  . Inflammatory polyps of colon with rectal bleeding ([place]) 12/96  . Lichen planus    genital,gums,legs,back  . Mitral valve prolapse   . Osteopenia   . Shingles 6/94  . Shoulder  fracture, left     Her Past Surgical History Is Significant For: Past Surgical History:  Procedure Laterality Date  . COLONOSCOPY  04/2014   Polyps - repeat 5 years - Dr. Oletta Lamas   . NOSE SURGERY     nasal repair  . SHOULDER ARTHROSCOPY Left 1998  . TONSILLECTOMY AND ADENOIDECTOMY     as child  . TOTAL HIP ARTHROPLASTY Left 2005    Her Family History Is Significant For: Family History  Problem Relation Age of Onset  . Lung cancer Mother     deceased  . Lung cancer Father     deceased  . Cancer - Colon Maternal Grandmother   . Prostate cancer Brother     Her Social History Is Significant For: Social History   Social History  . Marital status: Divorced    Spouse name: N/A  . Number of children: 1  . Years of education: college   Occupational History  . Retired     Social History Main Topics  . Smoking status: Never Smoker  . Smokeless tobacco: Never Used  . Alcohol use 1.0 - 1.5 oz/week    2 - 3 Standard drinks or equivalent per week  . Drug use: No  . Sexual activity: No   Other Topics Concern  . None   Social History Narrative   Drinks 1-2 cups of coffee a day     Her Allergies Are:  Allergies  Allergen Reactions  . Sulfa Antibiotics Other (See Comments)    Tight jaw  . Aleve [Naproxen Sodium]     Stomach bleeding  . Macrobid [Nitrofurantoin Macrocrystal]     Stomach cramps  . Morphine And Related Anxiety  :   Her Current Medications Are:  Outpatient Encounter Prescriptions as of 10/18/2015  Medication Sig  . betamethasone dipropionate 0.05 % lotion as directed.   . Biotin 5000 MCG TABS Take by mouth.  . Cholecalciferol (VITAMIN D3) 2000 units TABS Take by mouth.  . diclofenac sodium (VOLTAREN) 1 % GEL Apply topically 4 (four) times daily.  Marland Kitchen levocetirizine (XYZAL) 5 MG tablet Take 5 mg by mouth every evening.  . mometasone (NASONEX) 50 MCG/ACT nasal spray   . olopatadine (PATANOL) 0.1 % ophthalmic solution INSTILL 1 DROP TO AFFECTED EYES  TWICE DAILY AS NEEDED.  Marland Kitchen prednisoLONE (PRELONE) 15 MG/5ML SOLN Take by mouth 2 (two) times daily.   Marland Kitchen tretinoin (RETIN-A) 0.05 % cream Apply topically at bedtime.  . meloxicam (MOBIC) 15 MG tablet   . [DISCONTINUED] cetirizine (ZYRTEC) 10 MG tablet Take 10 mg by mouth daily.   No facility-administered encounter medications on file as of 10/18/2015.   :  Review of Systems:  Out of a complete 14 point review of systems, all are reviewed and negative with the exception of these symptoms as listed below:  Review of Systems  HENT:       Patient states that she has trouble swallowing at times.   Neurological:  Patient is here to discuss her sleep study.  She reports having trouble falling asleep, so she is thinking of restarting Melatonin at night.     Objective:  Neurologic Exam  Physical Exam Physical Examination:   Vitals:   10/18/15 0853  BP: 110/62  Pulse: 70  Resp: 14   General Examination: The patient is a very pleasant 70 y.o. female in no acute distress. She appears well-developed and well-nourished and well groomed. She is in good spirits today.  HEENT: Normocephalic, atraumatic, pupils are equal, round and reactive to light and accommodation. Funduscopic exam is normal with sharp disc margins noted. Extraocular tracking is good without limitation to gaze excursion or nystagmus noted. Normal smooth pursuit is noted. Hearing is grossly intact. Face is symmetric with normal facial animation and normal facial sensation. Speech is clear with no dysarthria noted. There is no hypophonia. There is no lip, neck/head, jaw or voice tremor. Neck is supple with full range of passive and active motion. There are no carotid bruits on auscultation. Oropharynx exam reveals: mild mouth dryness, good dental hygiene and moderate airway crowding, due to small airway and redundant soft palate, wider uvula, no significant residual tonsillar tissue. Mallampati is class I. Tongue protrudes  centrally and palate elevates symmetrically. Tonsils are absent. Nasal inspection reveals smaller nasal passages, right smaller than left.    Chest: Clear to auscultation without wheezing, rhonchi or crackles noted.  Heart: S1+S2+0, regular and normal without murmurs, rubs or gallops noted.   Abdomen: Soft, non-tender and non-distended with normal bowel sounds appreciated on auscultation.  Extremities: There is no pitting edema in the distal lower extremities bilaterally. Pedal pulses are intact.  Skin: Warm and dry without trophic changes noted. There are no varicose veins.  Musculoskeletal: exam reveals no obvious joint deformities, tenderness or joint swelling or erythema.   Neurologically:  Mental status: The patient is awake, alert and oriented in all 4 spheres. Her immediate and remote memory, attention, language skills and fund of knowledge are appropriate. There is no evidence of aphasia, agnosia, apraxia or anomia. Speech is clear with normal prosody and enunciation. Thought process is linear. Mood is normal and affect is normal.  Cranial nerves II - XII are as described above under HEENT exam. In addition: shoulder shrug is normal with equal shoulder height noted. Motor exam: Normal bulk, strength and tone is noted. Reflexes are 2+ throughout, Romberg negative. fine motor skills and coordination: intact in the UEs and LEs.  Cerebellar testing: No dysmetria or intention tremor.  Sensory exam: intact to light touch in the upper and lower extremities.  Gait, station and balance: She stands easily. No veering to one side is noted. No leaning to one side is noted. Posture is age-appropriate and stance is narrow based. Gait shows normal stride length and  pace. No problems turning are noted. Tandem walk is unremarkable.   Assessment and Plan:   In summary, DAZIAH HESLER is a very pleasant 70 year old female with an underlying medical history of hypertension, hyperlipidemia, mitral valve  prolapse, diverticulitis, osteopenia, arthritis, status post left total hip replacement in 2005, who returns for follow up consultation of her Sleep disturbance, after her recent sleep study. She had a baseline sleep study on 09/27/2015 and we talked about her test results in detail today. Reassuringly, she did not have any significant sleep disordered breathing and only mild intermittent snoring was noted. She had fairly benign sleep architecture with the exception of mild increase in light stage sleep.  She had moderate PLMS but with only minimal arousals and currently does not endorse any restless leg symptoms. We will monitor. She has trouble with sleep initiation and sometimes sleep maintenance. She has tried melatonin in the past and is encouraged to try it again. Furthermore, we reviewed good sleep hygiene measures today. Her physical exam is benign and stable. She is encouraged to consider increasing her exercise routine. She mentioned occasional problems with swallowing. I suggested that she try over-the-counter reflux medicine for a couple of weeks or 3 weeks and see if her issues improve, sometimes swallowing difficulties are secondary to silent or mild ongoing reflux. For snoring, she has tried a nasal device in the past called breathe with EEZ and this may help open up her nasal passages a little bit more at night. At this juncture, I suggested as needed follow-up with me. I answered all her questions today and the patient was in agreement.  I spent 25 minutes in total face-to-face time with the patient, more than 50% of which was spent in counseling and coordination of care, reviewing test results, reviewing medication and discussing or reviewing the diagnosis of PLMD, sleep disturbance, the prognosis and treatment options.

## 2015-10-18 NOTE — Patient Instructions (Addendum)
Your sleep study did not show any significant obstructive sleep apnea.   You had moderate leg twitching in sleep, which did not disturb your sleep very much.   You can be monitored for restless legs symptoms. Keep in mind restless legs syndrome (RLS) is associated with anemia and iron deficiency.  Please remember to try to maintain good sleep hygiene, which means: Keep a regular sleep and wake schedule, try not to exercise or have a meal within 2 hours of your bedtime, try to keep your bedroom conducive for sleep, that is, cool and dark, without light distractors such as an illuminated alarm clock, and refrain from watching TV right before sleep or in the middle of the night and do not keep the TV or radio on during the night. Also, try not to use or play on electronic devices at bedtime, such as your cell phone, tablet PC or laptop. If you like to read at bedtime on an electronic device, try to dim the background light as much as possible. Do not eat in the middle of the night.   You can try Melatonin at night for sleep: take 0.5 mg to 1 mg to 3 mg, one to 2 hours before your bedtime. You can go up to 5 mg if needed. It is over the counter and comes in pill form, chewable form and spray, if you prefer.    I will see you back as needed.  For your swallowing issues: could be mild ongoing/silent reflux: try an over the counter reflux medicine, such as Pepcid.

## 2015-11-03 ENCOUNTER — Ambulatory Visit (INDEPENDENT_AMBULATORY_CARE_PROVIDER_SITE_OTHER): Payer: Medicare Other | Admitting: Obstetrics & Gynecology

## 2015-11-03 ENCOUNTER — Encounter: Payer: Self-pay | Admitting: Obstetrics & Gynecology

## 2015-11-03 VITALS — BP 102/60 | HR 68 | Resp 16 | Ht 65.0 in | Wt 123.0 lb

## 2015-11-03 DIAGNOSIS — Z205 Contact with and (suspected) exposure to viral hepatitis: Secondary | ICD-10-CM | POA: Diagnosis not present

## 2015-11-03 DIAGNOSIS — Z01419 Encounter for gynecological examination (general) (routine) without abnormal findings: Secondary | ICD-10-CM | POA: Diagnosis not present

## 2015-11-03 NOTE — Progress Notes (Signed)
70 y.o. G1P1001 DivorcedCaucasianF here for annual exam.  Denies vaginal bleeding.  Having some issues with her bladder.  Still using her pessary.  Feels this really helps.  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  PCP:  Dr. Brigitte Pulse.  Last visit was in July.    Patient's last menstrual period was 01/07/1998.          Sexually active: No.  The current method of family planning is post menopausal status.    Exercising: No.  The patient does not participate in regular exercise at present. Smoker:  no  Health Maintenance: Pap:  08/02/14 negative  History of abnormal Pap:  yes MMG:  08/24/15 BIRADS 1 negative  Colonoscopy:  2016- polyp- repeat 5 years  BMD:   2014  TDaP:  Up to date with PCP (2012) Pneumonia vaccine(s):  PCP Zostavax:   PCP Hep C testing:  Will do it today Screening Labs: PCP, Hb today: PCP, Urine today: PCP   reports that she has never smoked. She has never used smokeless tobacco. She reports that she drinks about 1.0 - 1.5 oz of alcohol per week . She reports that she does not use drugs.  Past Medical History:  Diagnosis Date  . Abnormal Pap smear 1995   [place]  . Diverticulitis   . Hyperlipidemia   . Hypertension    ? being tested  . Inflammatory polyps of colon with rectal bleeding ([place]) 12/96  . Lichen planus    genital,gums,legs,back  . Mitral valve prolapse   . Osteopenia   . Shingles 6/94  . Shoulder fracture, left     Past Surgical History:  Procedure Laterality Date  . COLONOSCOPY  04/2014   Polyps - repeat 5 years - Dr. Oletta Lamas   . NOSE SURGERY     nasal repair  . SHOULDER ARTHROSCOPY Left 1998  . TONSILLECTOMY AND ADENOIDECTOMY     as child  . TOTAL HIP ARTHROPLASTY Left 2005    Current Outpatient Prescriptions  Medication Sig Dispense Refill  . betamethasone dipropionate 0.05 % lotion as directed.     . Biotin 5000 MCG TABS Take by mouth.    . Cholecalciferol (VITAMIN D3) 2000 units TABS Take by mouth.    .  Clobetasol Propionate Emulsion 0.05 % topical foam Apply topically as needed.    . diclofenac sodium (VOLTAREN) 1 % GEL Apply topically 4 (four) times daily.    Marland Kitchen levocetirizine (XYZAL) 5 MG tablet Take 5 mg by mouth every evening.    . Melatonin 5 MG TABS Take by mouth at bedtime.    . meloxicam (MOBIC) 15 MG tablet     . mometasone (NASONEX) 50 MCG/ACT nasal spray     . olopatadine (PATANOL) 0.1 % ophthalmic solution INSTILL 1 DROP TO AFFECTED EYES TWICE DAILY AS NEEDED.  6  . prednisoLONE (PRELONE) 15 MG/5ML SOLN Take by mouth 2 (two) times daily.     Marland Kitchen tretinoin (RETIN-A) 0.05 % cream Apply topically at bedtime.     No current facility-administered medications for this visit.     Family History  Problem Relation Age of Onset  . Lung cancer Mother     deceased  . Lung cancer Father     deceased  . Cancer - Colon Maternal Grandmother   . Prostate cancer Brother     ROS:  Pertinent items are noted in HPI.  Otherwise, a comprehensive ROS was negative.  Exam:   BP 102/60 (  BP Location: Right Arm, Patient Position: Sitting, Cuff Size: Normal)   Pulse 68   Resp 16   Ht 5\' 5"  (1.651 m)   Wt 123 lb (55.8 kg)   LMP 01/07/1998   BMI 20.47 kg/m    Height: 5\' 5"  (165.1 cm)  Ht Readings from Last 3 Encounters:  11/03/15 5\' 5"  (1.651 m)  10/18/15 5\' 5"  (1.651 m)  08/31/15 5\' 5"  (1.651 m)    General appearance: alert, cooperative and appears stated age Head: Normocephalic, without obvious abnormality, atraumatic Neck: no adenopathy, supple, symmetrical, trachea midline and thyroid normal to inspection and palpation Lungs: clear to auscultation bilaterally Breasts: normal appearance, no masses or tenderness Heart: regular rate and rhythm Abdomen: soft, non-tender; bowel sounds normal; no masses,  no organomegaly Extremities: extremities normal, atraumatic, no cyanosis or edema Skin: Skin color, texture, turgor normal. No rashes or lesions Lymph nodes: Cervical, supraclavicular, and  axillary nodes normal. No abnormal inguinal nodes palpated Neurologic: Grossly normal   Pelvic: External genitalia:  no lesions              Urethra:  normal appearing urethra with no masses, tenderness or lesions              Bartholins and Skenes: normal                 Vagina: normal appearing vagina with normal color and discharge, no lesions              Cervix: no lesions              Pap taken: No. Bimanual Exam:  Uterus:  normal size, contour, position, consistency, mobility, non-tender              Adnexa: normal adnexa and no mass, fullness, tenderness               Rectovaginal: Confirms               Anus:  normal sphincter tone, no lesions  Chaperone was present for exam.  A:  Well Woman with normal exam  PMP, no HRT  Fibromyalgia  Third degree cystocele with pessary use. Family hx of colon cancer. Colonoscopy 2016. Lichen planus (vulvar, mouth, lower extrimities)   P:  Mammogram guidelines reviewed.  Pt doing yearly imaging.  Pap 2016.  No pap today. Lab/vaccines with Dr. Brigitte Pulse yearly.   Hep C antibody will be obtained today. AEX 1 yr or prn

## 2015-11-04 LAB — HEPATITIS C ANTIBODY: HCV Ab: NEGATIVE

## 2016-08-20 DIAGNOSIS — I10 Essential (primary) hypertension: Secondary | ICD-10-CM | POA: Diagnosis not present

## 2016-08-20 DIAGNOSIS — M859 Disorder of bone density and structure, unspecified: Secondary | ICD-10-CM | POA: Diagnosis not present

## 2016-08-20 DIAGNOSIS — E784 Other hyperlipidemia: Secondary | ICD-10-CM | POA: Diagnosis not present

## 2016-08-27 DIAGNOSIS — R5383 Other fatigue: Secondary | ICD-10-CM | POA: Diagnosis not present

## 2016-08-27 DIAGNOSIS — Z6820 Body mass index (BMI) 20.0-20.9, adult: Secondary | ICD-10-CM | POA: Diagnosis not present

## 2016-08-27 DIAGNOSIS — I10 Essential (primary) hypertension: Secondary | ICD-10-CM | POA: Diagnosis not present

## 2016-08-27 DIAGNOSIS — Z0000 Encounter for general adult medical examination without abnormal findings: Secondary | ICD-10-CM | POA: Diagnosis not present

## 2016-08-27 DIAGNOSIS — M859 Disorder of bone density and structure, unspecified: Secondary | ICD-10-CM | POA: Diagnosis not present

## 2016-08-27 DIAGNOSIS — Z1389 Encounter for screening for other disorder: Secondary | ICD-10-CM | POA: Diagnosis not present

## 2016-08-27 DIAGNOSIS — E784 Other hyperlipidemia: Secondary | ICD-10-CM | POA: Diagnosis not present

## 2016-08-27 DIAGNOSIS — D72829 Elevated white blood cell count, unspecified: Secondary | ICD-10-CM | POA: Diagnosis not present

## 2016-08-27 DIAGNOSIS — B351 Tinea unguium: Secondary | ICD-10-CM | POA: Diagnosis not present

## 2016-08-27 DIAGNOSIS — R0982 Postnasal drip: Secondary | ICD-10-CM | POA: Diagnosis not present

## 2016-08-27 DIAGNOSIS — R110 Nausea: Secondary | ICD-10-CM | POA: Diagnosis not present

## 2016-08-27 DIAGNOSIS — J3089 Other allergic rhinitis: Secondary | ICD-10-CM | POA: Diagnosis not present

## 2016-09-04 DIAGNOSIS — Z1231 Encounter for screening mammogram for malignant neoplasm of breast: Secondary | ICD-10-CM | POA: Diagnosis not present

## 2016-09-07 DIAGNOSIS — E538 Deficiency of other specified B group vitamins: Secondary | ICD-10-CM

## 2016-09-07 HISTORY — DX: Deficiency of other specified B group vitamins: E53.8

## 2016-10-07 ENCOUNTER — Encounter: Payer: Self-pay | Admitting: Obstetrics & Gynecology

## 2016-10-17 DIAGNOSIS — Z23 Encounter for immunization: Secondary | ICD-10-CM | POA: Diagnosis not present

## 2016-10-17 DIAGNOSIS — R5383 Other fatigue: Secondary | ICD-10-CM | POA: Diagnosis not present

## 2016-10-17 DIAGNOSIS — R110 Nausea: Secondary | ICD-10-CM | POA: Diagnosis not present

## 2016-10-17 DIAGNOSIS — Z6820 Body mass index (BMI) 20.0-20.9, adult: Secondary | ICD-10-CM | POA: Diagnosis not present

## 2016-10-17 DIAGNOSIS — R42 Dizziness and giddiness: Secondary | ICD-10-CM | POA: Diagnosis not present

## 2016-10-17 DIAGNOSIS — E538 Deficiency of other specified B group vitamins: Secondary | ICD-10-CM | POA: Diagnosis not present

## 2016-10-17 DIAGNOSIS — D72829 Elevated white blood cell count, unspecified: Secondary | ICD-10-CM | POA: Diagnosis not present

## 2016-10-23 DIAGNOSIS — E538 Deficiency of other specified B group vitamins: Secondary | ICD-10-CM | POA: Diagnosis not present

## 2016-10-30 DIAGNOSIS — E538 Deficiency of other specified B group vitamins: Secondary | ICD-10-CM | POA: Diagnosis not present

## 2016-10-31 ENCOUNTER — Encounter: Payer: Self-pay | Admitting: Obstetrics & Gynecology

## 2016-10-31 ENCOUNTER — Telehealth: Payer: Self-pay | Admitting: *Deleted

## 2016-10-31 NOTE — Telephone Encounter (Signed)
See telephone encounter dated 10/31/16.

## 2016-10-31 NOTE — Progress Notes (Deleted)
71 y.o. G1P1001 DivorcedCaucasianF here for annual exam.    Patient's last menstrual period was 01/07/1998.          Sexually active: {yes no:314532}  The current method of family planning is post menopausal status.    Exercising: {yes no:314532}  {types:19826} Smoker:  {YES NO:22349}  Health Maintenance: Pap:  08/02/14 Neg   05/29/12 Neg  History of abnormal Pap:  Yes, ASCUS 1995 MMG:  09/04/16 BIRADS2:Benign  Colonoscopy:  2016 polyp. F/u 5 years  BMD:   2014 TDaP:  2012 Pneumonia vaccine(s):  PCP Zostavax:   PCP Hep C testing: 11/03/15 Neg  Screening Labs: ***, Hb today: ***, Urine today: ***   reports that she has never smoked. She has never used smokeless tobacco. She reports that she drinks about 1.0 - 1.5 oz of alcohol per week . She reports that she does not use drugs.  Past Medical History:  Diagnosis Date  . Abnormal Pap smear 1995   [place]  . Diverticulitis   . Hyperlipidemia   . Hypertension    ? being tested  . Inflammatory polyps of colon with rectal bleeding ([place]) 12/96  . Lichen planus    genital,gums,legs,back  . Mitral valve prolapse   . Osteopenia   . Shingles 6/94  . Shoulder fracture, left     Past Surgical History:  Procedure Laterality Date  . COLONOSCOPY  04/2014   Polyps - repeat 5 years - Dr. Oletta Lamas   . NOSE SURGERY     nasal repair  . SHOULDER ARTHROSCOPY Left 1998  . TONSILLECTOMY AND ADENOIDECTOMY     as child  . TOTAL HIP ARTHROPLASTY Left 2005    Current Outpatient Prescriptions  Medication Sig Dispense Refill  . betamethasone dipropionate 0.05 % lotion as directed.     . Biotin 5000 MCG TABS Take by mouth.    . Cholecalciferol (VITAMIN D3) 2000 units TABS Take by mouth.    . Clobetasol Propionate Emulsion 0.05 % topical foam Apply topically as needed.    . diclofenac sodium (VOLTAREN) 1 % GEL Apply topically 4 (four) times daily.    Marland Kitchen levocetirizine (XYZAL) 5 MG tablet Take 5 mg by mouth every evening.    . Melatonin 5 MG TABS  Take by mouth at bedtime.    . meloxicam (MOBIC) 15 MG tablet     . mometasone (NASONEX) 50 MCG/ACT nasal spray     . olopatadine (PATANOL) 0.1 % ophthalmic solution INSTILL 1 DROP TO AFFECTED EYES TWICE DAILY AS NEEDED.  6  . prednisoLONE (PRELONE) 15 MG/5ML SOLN Take by mouth 2 (two) times daily.     Marland Kitchen tretinoin (RETIN-A) 0.05 % cream Apply topically at bedtime.     No current facility-administered medications for this visit.     Family History  Problem Relation Age of Onset  . Lung cancer Mother        deceased  . Lung cancer Father        deceased  . Cancer - Colon Maternal Grandmother   . Prostate cancer Brother     ROS:  Pertinent items are noted in HPI.  Otherwise, a comprehensive ROS was negative.  Exam:   LMP 01/07/1998   Weight change: @WEIGHTCHANGE @ Height:      Ht Readings from Last 3 Encounters:  11/03/15 5\' 5"  (1.651 m)  10/18/15 5\' 5"  (1.651 m)  08/31/15 5\' 5"  (1.651 m)    General appearance: alert, cooperative and appears stated age Head: Normocephalic, without obvious abnormality,  atraumatic Neck: no adenopathy, supple, symmetrical, trachea midline and thyroid {EXAM; THYROID:18604} Lungs: clear to auscultation bilaterally Breasts: {Exam; breast:13139::"normal appearance, no masses or tenderness"} Heart: regular rate and rhythm Abdomen: soft, non-tender; bowel sounds normal; no masses,  no organomegaly Extremities: extremities normal, atraumatic, no cyanosis or edema Skin: Skin color, texture, turgor normal. No rashes or lesions Lymph nodes: Cervical, supraclavicular, and axillary nodes normal. No abnormal inguinal nodes palpated Neurologic: Grossly normal   Pelvic: External genitalia:  no lesions              Urethra:  normal appearing urethra with no masses, tenderness or lesions              Bartholins and Skenes: normal                 Vagina: normal appearing vagina with normal color and discharge, no lesions              Cervix: {exam;  cervix:14595}              Pap taken: {yes no:314532} Bimanual Exam:  Uterus:  {exam; uterus:12215}              Adnexa: {exam; adnexa:12223}               Rectovaginal: Confirms               Anus:  normal sphincter tone, no lesions  Chaperone was present for exam.  A:  Well Woman with normal exam  P:   {plan; gyn:5269::"mammogram","pap smear","return annually or prn"}

## 2016-10-31 NOTE — Telephone Encounter (Signed)
Spoke with patient in regards to MyChart message as seen below. Patient states she has not "felt well" since May/June. Experiencing intermittent nausea and extreme fatigue. Reports the fatigue has increased over last 3 weeks. Reports intermittent left groin pain, sore to touch. Has seen PCP, recommended f/u with GYN. Started B12 shots weekly x4 weeks, has received 2.   Denies vaginal bleeding, pessary in place. Occassional vaginal d/c without odor with pessary, states this is her "normal".   Last AEX 11/03/15.  Patient scheduled for OV on 11/01/16 at 10:30am with Dr. Sabra Heck. Advised patient Dr. Sabra Heck will review, will return call with any additional recommendations. Patient verbalizes understanding and is agreeable.  Routing to provider for final review. Patient is agreeable to disposition. Will close encounter.       From [place] H Enright To Megan Salon, MD Sent 10/31/2016 9:13 AM  Dr. Sabra Heck,  I am scheduled to see you in February, but I have concerns about recent issues I've had. Dr. Brigitte Pulse suggested (at my annual physical in August) that I consult you about potential ovarian cancer screening. May I set an earlier appointment, please?  Thanks,  [employer]

## 2016-11-01 ENCOUNTER — Ambulatory Visit (INDEPENDENT_AMBULATORY_CARE_PROVIDER_SITE_OTHER): Payer: Medicare Other | Admitting: Obstetrics & Gynecology

## 2016-11-01 ENCOUNTER — Encounter: Payer: Self-pay | Admitting: Obstetrics & Gynecology

## 2016-11-01 VITALS — BP 130/80 | HR 92 | Temp 98.1°F | Resp 14 | Ht 64.5 in | Wt 126.0 lb

## 2016-11-01 DIAGNOSIS — R1032 Left lower quadrant pain: Secondary | ICD-10-CM | POA: Diagnosis not present

## 2016-11-01 DIAGNOSIS — R1909 Other intra-abdominal and pelvic swelling, mass and lump: Secondary | ICD-10-CM | POA: Diagnosis not present

## 2016-11-01 DIAGNOSIS — L439 Lichen planus, unspecified: Secondary | ICD-10-CM | POA: Diagnosis not present

## 2016-11-01 NOTE — Progress Notes (Signed)
GYNECOLOGY  VISIT  CC:   Groin/pelvic pain, fatigue, intermittent nausea  HPI: 71 y.o. G74P1001 Divorced Caucasian female here for complaint of intermittent nausea and LLQ/groin pain that started in the spring. Nausea is intermittent.  It does not seem to have cause.  Can occur three times in a week but then skip weeks and not have any.  Has seen Dr. Durward Fortes in the past year.  H/O left hip replacement.  Has seen massage therapist to work on left psoas but this hasn't really helped.    Denies vaginal bleeding.  Feels her prolapse is worse.  Using her pessary more frequently.  Does not feel using or not using the pessary has not changed her pain.  Also reports significant fatigue that started about three weeks ago.  Saw saw her PCP about three weeks ago.  Had Vit D, TSH, lipids, CMP, CBC in 8/18.  Did have repeat CBC and B 12 done three weeks ago.  B 12 is low.    GYNECOLOGIC HISTORY: Patient's last menstrual period was 01/07/1998. Contraception: post menopausal  Menopausal hormone therapy: none  Patient Active Problem List   Diagnosis Date Noted  . Dyspnea 08/06/2012  . Fatigue 08/06/2012  . MVP (mitral valve prolapse) 08/06/2012  . Cystocele 04/01/2012  . Rectocele 04/01/2012  . Uterine prolapse 04/01/2012  . Genuine stress incontinence, female 04/01/2012    Past Medical History:  Diagnosis Date  . Abnormal Pap smear 1995   [place]  . Diverticulitis   . Hyperlipidemia   . Hypertension    ? being tested  . Inflammatory polyps of colon with rectal bleeding ([place]) 12/96  . Lichen planus    genital,gums,legs,back  . Mitral valve prolapse   . Osteopenia   . Shingles 6/94  . Shoulder fracture, left   . Vitamin B12 deficiency 09/2016    Past Surgical History:  Procedure Laterality Date  . COLONOSCOPY  04/2014   Polyps - repeat 5 years - Dr. Oletta Lamas   . NOSE SURGERY     nasal repair  . SHOULDER ARTHROSCOPY Left 1998  . TONSILLECTOMY AND ADENOIDECTOMY     as child  . TOTAL  HIP ARTHROPLASTY Left 2005    MEDS:   Current Outpatient Prescriptions on File Prior to Visit  Medication Sig Dispense Refill  . betamethasone dipropionate 0.05 % lotion as directed.     . Biotin 5000 MCG TABS Take by mouth.    . Cholecalciferol (VITAMIN D3) 2000 units TABS Take by mouth.    . Clobetasol Propionate Emulsion 0.05 % topical foam Apply topically as needed.    . diclofenac sodium (VOLTAREN) 1 % GEL Apply topically 4 (four) times daily.    Marland Kitchen olopatadine (PATANOL) 0.1 % ophthalmic solution INSTILL 1 DROP TO AFFECTED EYES TWICE DAILY AS NEEDED.  6  . prednisoLONE (PRELONE) 15 MG/5ML SOLN Take by mouth 2 (two) times daily.     Marland Kitchen tretinoin (RETIN-A) 0.05 % cream Apply topically at bedtime.     No current facility-administered medications on file prior to visit.     ALLERGIES: Sulfa antibiotics; Aleve [naproxen sodium]; Macrobid [nitrofurantoin macrocrystal]; and Morphine and related  Family History  Problem Relation Age of Onset  . Lung cancer Mother        deceased  . Lung cancer Father        deceased  . Cancer - Colon Maternal Grandmother   . Prostate cancer Brother     SH:  Divorced, non smoker  Review of Systems  Neurological: Positive for weakness.  All other systems reviewed and are negative.   PHYSICAL EXAMINATION:    BP 130/80 (BP Location: Right Arm, Patient Position: Sitting, Cuff Size: Normal)   Pulse 92   Temp 98.1 F (36.7 C) (Oral)   Resp 14   Ht 5' 4.5" (1.638 m)   Wt 126 lb (57.2 kg)   LMP 01/07/1998   BMI 21.29 kg/m   Weight change:  +3 #   General appearance: alert, cooperative and appears stated age Abdomen: soft, non-tender; bowel sounds normal; no masses,  no organomegaly  Pelvic: External genitalia:  no lesions              Urethra:  normal appearing urethra with no masses, tenderness or lesions              Bartholins and Skenes: normal                 Vagina: normal appearing vagina with normal color and discharge, no lesions,  second degree cystocele              Cervix: no lesions              Bimanual Exam:  Uterus:  normal size, contour, position, consistency, mobility, non-tender              Adnexa: no mass, fullness, tenderness              Rectovaginal: Yes.  .  Confirms.              Anus:  normal sphincter tone, no lesions  Chaperone was present for exam.  Assessment: Fatigue LLQ pain H/o Lichen planus B 12 deficiency  Plan: Return for PUS.  Ca-125 will be obtiained today.  Exam normal so I have a low suspicion for ovarian cancer.   ~20 minutes spent with patient >50% of time was in face to face discussion of above.

## 2016-11-02 LAB — CA 125: Cancer Antigen (CA) 125: 13.5 U/mL (ref 0.0–38.1)

## 2016-11-05 ENCOUNTER — Other Ambulatory Visit: Payer: Self-pay | Admitting: *Deleted

## 2016-11-05 DIAGNOSIS — R1032 Left lower quadrant pain: Secondary | ICD-10-CM

## 2016-11-05 DIAGNOSIS — R1909 Other intra-abdominal and pelvic swelling, mass and lump: Secondary | ICD-10-CM

## 2016-11-06 DIAGNOSIS — E538 Deficiency of other specified B group vitamins: Secondary | ICD-10-CM | POA: Diagnosis not present

## 2016-11-11 DIAGNOSIS — Z1212 Encounter for screening for malignant neoplasm of rectum: Secondary | ICD-10-CM | POA: Diagnosis not present

## 2016-11-13 DIAGNOSIS — E538 Deficiency of other specified B group vitamins: Secondary | ICD-10-CM | POA: Diagnosis not present

## 2016-11-21 ENCOUNTER — Ambulatory Visit (INDEPENDENT_AMBULATORY_CARE_PROVIDER_SITE_OTHER): Payer: Medicare Other

## 2016-11-21 ENCOUNTER — Ambulatory Visit (INDEPENDENT_AMBULATORY_CARE_PROVIDER_SITE_OTHER): Payer: Medicare Other | Admitting: Obstetrics & Gynecology

## 2016-11-21 VITALS — BP 120/60 | HR 68 | Resp 14 | Ht 62.5 in | Wt 126.0 lb

## 2016-11-21 DIAGNOSIS — R1909 Other intra-abdominal and pelvic swelling, mass and lump: Secondary | ICD-10-CM

## 2016-11-21 DIAGNOSIS — R1032 Left lower quadrant pain: Secondary | ICD-10-CM | POA: Diagnosis not present

## 2016-11-21 NOTE — Progress Notes (Signed)
71 y.o. G21P1001 Divorced Caucasian female here for pelvic ultrasound due to LLQ pain.  Pt has been experiencing nausea as well.  Has a B12 deficiency that has been discovered by Dr. Brigitte Pulse, her PCP.  She feel this has helped the nausea.  Has done the four weeks of injections.  Will have level checked again in a few months.    At last visit, we decided to proceed with PUS to assess ovaries to be sure they appear normal.  Exam was normal.  Pt does have some prolapse but does not have any desire to have this surgically repaired.  Typically, this does not cause LLQ pain and when she uses her pessary, the pain is not improved.    She does have h/o left hip replacement.  Patient's last menstrual period was 01/07/1998.  Contraception: PMP  Findings:  UTERUS: 5.0 x 4.3 x 2.5cm EMS: 1.68mm, thin and symmetric ADNEXA: Left ovary: 2.5 x 0.7 x 0.9cm, atrophic       Right ovary: 2.0 x 1.0 x 1.0cm CUL DE SAC: no free fluid  Discussion:  Findings and results reviewed.  Pt did have increase PVR despite voiding twice but she is not wearing her pessary today.  She is not having any recurrent UTI symptoms.  Pt and I discussed as long as she is not symptomatic (enough to be bothersome to her) and not having recurrent UTIs, then she does not need to proceed with any surgical correction of her prolapse.  I think the ultrasound does demonstrate that she would benefit from using the pessary some more.  She voices understanding.    Assessment:  LLQ pain with normal PUS today Increase PVR due to uterine prolapse and associated cystocele  Plan:  Pt is going to return to ortho for additional evaluation at this point as no source of pain has been identified  ~15 minutes spent with patient >50% of time was in face to face discussion of above.

## 2016-11-23 ENCOUNTER — Encounter: Payer: Self-pay | Admitting: Obstetrics & Gynecology

## 2016-12-11 DIAGNOSIS — E538 Deficiency of other specified B group vitamins: Secondary | ICD-10-CM | POA: Diagnosis not present

## 2017-01-08 DIAGNOSIS — E538 Deficiency of other specified B group vitamins: Secondary | ICD-10-CM | POA: Diagnosis not present

## 2017-01-15 ENCOUNTER — Encounter: Payer: Self-pay | Admitting: Obstetrics and Gynecology

## 2017-01-15 ENCOUNTER — Telehealth: Payer: Self-pay | Admitting: Obstetrics and Gynecology

## 2017-01-15 ENCOUNTER — Other Ambulatory Visit: Payer: Self-pay

## 2017-01-15 ENCOUNTER — Ambulatory Visit (INDEPENDENT_AMBULATORY_CARE_PROVIDER_SITE_OTHER): Payer: Medicare Other | Admitting: Obstetrics and Gynecology

## 2017-01-15 VITALS — BP 112/74 | HR 80 | Temp 98.2°F | Resp 14 | Wt 129.0 lb

## 2017-01-15 DIAGNOSIS — R3915 Urgency of urination: Secondary | ICD-10-CM | POA: Diagnosis not present

## 2017-01-15 DIAGNOSIS — M545 Low back pain, unspecified: Secondary | ICD-10-CM

## 2017-01-15 DIAGNOSIS — N3090 Cystitis, unspecified without hematuria: Secondary | ICD-10-CM | POA: Diagnosis not present

## 2017-01-15 DIAGNOSIS — N8111 Cystocele, midline: Secondary | ICD-10-CM | POA: Diagnosis not present

## 2017-01-15 DIAGNOSIS — R1031 Right lower quadrant pain: Secondary | ICD-10-CM

## 2017-01-15 LAB — POCT URINALYSIS DIPSTICK
BILIRUBIN UA: NEGATIVE
GLUCOSE UA: NEGATIVE
KETONES UA: NEGATIVE
Nitrite, UA: NEGATIVE
ODOR: NORMAL
Protein, UA: NEGATIVE
UROBILINOGEN UA: NEGATIVE U/dL — AB
pH, UA: 5 (ref 5.0–8.0)

## 2017-01-15 MED ORDER — FOSFOMYCIN TROMETHAMINE 3 G PO PACK: 3.0000 g | PACK | Freq: Once | ORAL | Status: DC

## 2017-01-15 MED ORDER — PHENAZOPYRIDINE HCL 200 MG PO TABS: 200.0000 mg | ORAL_TABLET | Freq: Three times a day (TID) | ORAL | 0 refills | Status: DC | PRN

## 2017-01-15 MED ORDER — FOSFOMYCIN TROMETHAMINE 3 G PO PACK: 3.0000 g | PACK | Freq: Once | ORAL | 0 refills | Status: AC

## 2017-01-15 NOTE — Telephone Encounter (Signed)
New order placed for fosfomycin 3 gram pack to [facility]. Previous order  entered as clinic administered med, order d/c.   Call returned to patient, advised [facility], apologies for any inconvience. Patient thankful for return call and verbalizes understanding.   Routing to provider for final review. Patient is agreeable to disposition. Will close encounter.

## 2017-01-15 NOTE — Progress Notes (Signed)
GYNECOLOGY  VISIT   HPI: 72 y.o.   Divorced  Caucasian  female   G1P1001 with Patient's last menstrual period was 01/07/1998.here for  Urinary urgency and night urination. The patient has a h/o a grade 3 cystocele and uses a pessary 80% of the time. She mostly feels the pessary holds her cystocele up. She had the pessary out last week, put it back on on Sunday morning. Sunday night she started having right lower back pain, radiating to her RLQ. She also noted pressure in her bladder. The discomfort persisted, helped with ibuprofen. She took the pessary out yesterday. The bladder pressure has continued. She c/o urinary frequency and urgency. Voiding normal amounts. No dysuria, just a lot of pressure on her bladder. She has had some chills, no documented fever.  Pessary was noted to be appropriately fitted in 10/18 by Dr Sabra Heck (per patient).   GYNECOLOGIC HISTORY: Patient's last menstrual period was 01/07/1998. Contraception:Postmenopausal Menopausal hormone therapy: none        OB History    Gravida Para Term Preterm AB Living   1 1 1     1    SAB TAB Ectopic Multiple Live Births                     Patient Active Problem List   Diagnosis Date Noted  . Lichen planus 31/49/7026  . Dyspnea 08/06/2012  . Fatigue 08/06/2012  . MVP (mitral valve prolapse) 08/06/2012  . Cystocele 04/01/2012  . Rectocele 04/01/2012  . Uterine prolapse 04/01/2012  . Genuine stress incontinence, female 04/01/2012    Past Medical History:  Diagnosis Date  . Abnormal Pap smear 1995   [place]  . Diverticulitis   . Hyperlipidemia   . Hypertension    ? being tested  . Inflammatory polyps of colon with rectal bleeding ([place]) 12/96  . Lichen planus    genital,gums,legs,back  . Mitral valve prolapse   . Osteopenia   . Shingles 6/94  . Shoulder fracture, left   . Vitamin B12 deficiency 09/2016    Past Surgical History:  Procedure Laterality Date  . COLONOSCOPY  04/2014   Polyps - repeat 5 years - Dr.  Oletta Lamas   . NOSE SURGERY     nasal repair  . SHOULDER ARTHROSCOPY Left 1998  . TONSILLECTOMY AND ADENOIDECTOMY     as child  . TOTAL HIP ARTHROPLASTY Left 2005    Current Outpatient Medications  Medication Sig Dispense Refill  . betamethasone dipropionate 0.05 % lotion as directed.     . Biotin 5000 MCG TABS Take by mouth.    . Cholecalciferol (VITAMIN D3) 2000 units TABS Take by mouth.    . Clobetasol Propionate Emulsion 0.05 % topical foam Apply topically as needed.    . cyanocobalamin (,VITAMIN B-12,) 1000 MCG/ML injection Inject 1,000 mcg into the muscle once a week.    . diclofenac sodium (VOLTAREN) 1 % GEL Apply topically 4 (four) times daily.    . fluticasone (FLONASE) 50 MCG/ACT nasal spray Place 1 spray into both nostrils 2 (two) times daily.  11  . loratadine (CLARITIN) 10 MG tablet Take 10 mg by mouth daily.    Marland Kitchen olopatadine (PATANOL) 0.1 % ophthalmic solution INSTILL 1 DROP TO AFFECTED EYES TWICE DAILY AS NEEDED.  6  . prednisoLONE (PRELONE) 15 MG/5ML SOLN Take by mouth 2 (two) times daily.     Marland Kitchen tretinoin (RETIN-A) 0.05 % cream Apply topically at bedtime.     No current facility-administered medications  for this visit.      ALLERGIES: Sulfa antibiotics; Aleve [naproxen sodium]; Macrobid [nitrofurantoin macrocrystal]; and Morphine and related  Family History  Problem Relation Age of Onset  . Lung cancer Mother        deceased  . Lung cancer Father        deceased  . Cancer - Colon Maternal Grandmother   . Prostate cancer Brother     Social History   Socioeconomic History  . Marital status: Divorced    Spouse name: Not on file  . Number of children: 1  . Years of education: college  . Highest education level: Not on file  Social Needs  . Financial resource strain: Not on file  . Food insecurity - worry: Not on file  . Food insecurity - inability: Not on file  . Transportation needs - medical: Not on file  . Transportation needs - non-medical: Not on file   Occupational History  . Occupation: Retired   Tobacco Use  . Smoking status: Never Smoker  . Smokeless tobacco: Never Used  Substance and Sexual Activity  . Alcohol use: Yes    Alcohol/week: 1.0 - 1.5 oz    Types: 2 - 3 Standard drinks or equivalent per week  . Drug use: No  . Sexual activity: No    Partners: Male    Birth control/protection: Post-menopausal  Other Topics Concern  . Not on file  Social History Narrative   Drinks 1-2 cups of coffee a day     Review of Systems  Constitutional: Positive for chills.       Heat/cold intolerance  HENT: Negative.   Eyes: Negative.   Respiratory: Negative.   Cardiovascular: Negative.   Gastrointestinal: Positive for abdominal pain and diarrhea.  Genitourinary: Positive for urgency.       Night urination  Musculoskeletal: Negative.   Skin: Negative.   Neurological: Negative.   Endo/Heme/Allergies: Negative.   Psychiatric/Behavioral: Negative.     PHYSICAL EXAMINATION:    BP 112/74 (BP Location: Right Arm, Patient Position: Sitting, Cuff Size: Normal)   Pulse 80   Temp 98.2 F (36.8 C) (Oral)   Resp 14   Wt 129 lb (58.5 kg)   LMP 01/07/1998   BMI 23.22 kg/m     General appearance: alert, cooperative and appears stated age Abdomen: soft, tender in the RLQ, no rebound or guarding; distended in the RLQ. Mildly tender in the suprapubic region. No masses,  no organomegaly CVA: not tender  Pelvic: External genitalia:  no lesions              Urethra:  normal appearing urethra with no masses, tenderness or lesions              Bartholins and Skenes: normal                 Vagina:  Grade 1-2 cystocele with gentle cough, not examined supine or after continued valsalva (h/o grade 3 cystocele.              Cervix: no cervical motion tenderness              Bimanual Exam:  Uterus:  normal size, contour, position, consistency, mobility, non-tender              Adnexa: no mass, fullness, tenderness              Bladder: very  tender to palpation  Chaperone was present for exam.  Urine dip: 3+WBC, 3+RBC  ASSESSMENT Cystitis Prolapse RLQ abdominal and back pain, she doesn't have an acute abdomen. Diarrhea this am Right sided back pain   PLAN Treat with fosfomycin and pyridium Send urine for ua, c&s Call with worsening symptoms or if she isn't feeling better in 48 hours Recommended she f/u with her primary for her RLQ abdominal pain Continue Ibuprofen as needed, advised to check her temp prior to taking it    An After Visit Summary was printed and given to the patient.

## 2017-01-15 NOTE — Telephone Encounter (Signed)
Patient says the pharmacy has not gotten her prescription for the antibiotic. [facility] on [address]

## 2017-01-15 NOTE — Patient Instructions (Signed)

## 2017-01-16 LAB — URINALYSIS, MICROSCOPIC ONLY
CASTS: NONE SEEN /LPF
Epithelial Cells (non renal): NONE SEEN /hpf (ref 0–10)
WBC, UA: >30 /hpf — AB (ref 0–?)

## 2017-01-17 ENCOUNTER — Telehealth: Payer: Self-pay | Admitting: *Deleted

## 2017-01-17 LAB — URINE CULTURE

## 2017-01-17 NOTE — Telephone Encounter (Signed)
Urine culture result received and called to Dr Talbert Nan. Patient states Pyridium was very helpful and she is overall feeling much better. Still has some back and side pain but aware this may be another issue and should see PCP.  States UTI symptoms are much better. Advised will call back if any changes when final culture results. She should call back if symptoms return.  Routing to provider for final review. Patient agreeable to disposition. Will close encounter.

## 2017-01-21 LAB — MIN INHIBITORY CONC (1 DRUG)

## 2017-01-21 LAB — SPECIMEN STATUS REPORT

## 2017-02-06 DIAGNOSIS — E538 Deficiency of other specified B group vitamins: Secondary | ICD-10-CM | POA: Diagnosis not present

## 2017-02-21 ENCOUNTER — Other Ambulatory Visit: Payer: Self-pay

## 2017-02-21 ENCOUNTER — Ambulatory Visit (INDEPENDENT_AMBULATORY_CARE_PROVIDER_SITE_OTHER): Payer: Medicare Other | Admitting: Obstetrics & Gynecology

## 2017-02-21 ENCOUNTER — Other Ambulatory Visit (HOSPITAL_COMMUNITY)
Admission: RE | Admit: 2017-02-21 | Discharge: 2017-02-21 | Disposition: A | Discharge status: Home / Self Care | Payer: Medicare Other | Source: Ambulatory Visit | Attending: Obstetrics & Gynecology | Admitting: Obstetrics & Gynecology

## 2017-02-21 ENCOUNTER — Encounter: Payer: Self-pay | Admitting: Obstetrics & Gynecology

## 2017-02-21 VITALS — BP 120/60 | HR 72 | Resp 16 | Ht 64.5 in | Wt 125.0 lb

## 2017-02-21 DIAGNOSIS — Z01419 Encounter for gynecological examination (general) (routine) without abnormal findings: Secondary | ICD-10-CM | POA: Diagnosis not present

## 2017-02-21 DIAGNOSIS — Z124 Encounter for screening for malignant neoplasm of cervix: Secondary | ICD-10-CM | POA: Insufficient documentation

## 2017-02-21 NOTE — Progress Notes (Signed)
72 y.o. G1P1001 DivorcedCaucasianF here for annual exam.  Had UTI in early January.  Denies vaginal bleeding.  Reports she feels like she is emptying bladder.  On ultrasound last fall, bladder was not fully emptying.    Feels B12 shots have really helped.  Receives injection now monthly but having blood work only year now.    Patient's last menstrual period was 01/07/1998.          Sexually active: No The current method of family planning is post menopausal status.  Exercising: No.   Smoker:  no  Health Maintenance: Pap:  08/02/14 Neg   05/29/12 Neg  History of abnormal Pap:  Yes, mild dysplasia 1990  MMG:  09/04/16 BIRADS2:Benign  Colonoscopy:  2016 polyp. F/u 5 years.  BMD:  2014 by PCP TDaP:  2012 Pneumonia vaccine(s):  PCP Shingrix:   PCP Hep C testing: 11/03/15 Neg  Screening Labs: PCP   reports that  has never smoked. she has never used smokeless tobacco. She reports that she drinks about 1.0 - 1.5 oz of alcohol per week. She reports that she does not use drugs.  Past Medical History:  Diagnosis Date  . Abnormal Pap smear 1995   [place]  . Diverticulitis   . Hyperlipidemia   . Hypertension    ? being tested  . Inflammatory polyps of colon with rectal bleeding ([place]) 12/96  . Lichen planus    genital,gums,legs,back  . Mitral valve prolapse   . Osteopenia   . Shingles 6/94  . Shoulder fracture, left   . Vitamin B12 deficiency 09/2016    Past Surgical History:  Procedure Laterality Date  . COLONOSCOPY  04/2014   Polyps - repeat 5 years - Dr. Oletta Lamas   . NOSE SURGERY     nasal repair  . SHOULDER ARTHROSCOPY Left 1998  . TONSILLECTOMY AND ADENOIDECTOMY     as child  . TOTAL HIP ARTHROPLASTY Left 2005    Current Outpatient Medications  Medication Sig Dispense Refill  . betamethasone dipropionate 0.05 % lotion as directed.     . Biotin 5000 MCG TABS Take by mouth.    . Cholecalciferol (VITAMIN D3) 2000 units TABS Take by mouth.    . Clobetasol Propionate Emulsion  0.05 % topical foam Apply topically as needed.    . cyanocobalamin (,VITAMIN B-12,) 1000 MCG/ML injection Inject 1,000 mcg into the muscle every 30 (thirty) days.     . diclofenac sodium (VOLTAREN) 1 % GEL Apply topically 4 (four) times daily.    . fluticasone (FLONASE) 50 MCG/ACT nasal spray Place 1 spray into both nostrils 2 (two) times daily.  11  . loratadine (CLARITIN) 10 MG tablet Take 10 mg by mouth daily.    Marland Kitchen olopatadine (PATANOL) 0.1 % ophthalmic solution INSTILL 1 DROP TO AFFECTED EYES TWICE DAILY AS NEEDED.  6  . phenazopyridine (PYRIDIUM) 200 MG tablet Take 1 tablet (200 mg total) by mouth 3 (three) times daily as needed. 6 tablet 0  . prednisoLONE (PRELONE) 15 MG/5ML SOLN Take by mouth 2 (two) times daily.     Marland Kitchen tretinoin (RETIN-A) 0.05 % cream Apply topically at bedtime.     No current facility-administered medications for this visit.     Family History  Problem Relation Age of Onset  . Lung cancer Mother        deceased  . Lung cancer Father        deceased  . Cancer - Colon Maternal Grandmother   . Prostate cancer Brother   .  Pancreatic cancer Brother     ROS:  Pertinent items are noted in HPI.  Otherwise, a comprehensive ROS was negative.  Exam:   BP 120/60 (BP Location: Right Arm, Patient Position: Sitting, Cuff Size: Normal)   Pulse 72   Resp 16   Ht 5' 4.5" (1.638 m)   Wt 125 lb (56.7 kg)   LMP 01/07/1998   BMI 21.12 kg/m   Height: 5' 4.5" (163.8 cm)  Ht Readings from Last 3 Encounters:  02/21/17 5' 4.5" (1.638 m)  11/21/16 5' 2.5" (1.588 m)  11/01/16 5' 4.5" (1.638 m)    General appearance: alert, cooperative and appears stated age Head: Normocephalic, without obvious abnormality, atraumatic Neck: no adenopathy, supple, symmetrical, trachea midline and thyroid normal to inspection and palpation Lungs: clear to auscultation bilaterally Breasts: normal appearance, no masses or tenderness Heart: regular rate and rhythm Abdomen: soft, non-tender; bowel  sounds normal; no masses,  no organomegaly Extremities: extremities normal, atraumatic, no cyanosis or edema Skin: Skin color, texture, turgor normal. No rashes or lesions Lymph nodes: Cervical, supraclavicular, and axillary nodes normal. No abnormal inguinal nodes palpated Neurologic: Grossly normal   Pelvic: External genitalia:  no lesions              Urethra:  normal appearing urethra with no masses, tenderness or lesions              Bartholins and Skenes: normal                 Vagina: normal appearing vagina with normal color and discharge, no lesions, third degree cystocele              Cervix: no lesions              Pap taken: Yes.   Bimanual Exam:  Uterus:  normal size, contour, position, consistency, mobility, non-tender              Adnexa: normal adnexa and no mass, fullness, tenderness               Rectovaginal: Confirms               Anus:  normal sphincter tone, no lesions  Chaperone was present for exam.  A:  Well Woman with normal exam PMP, no HRT Fibromyalgia B12 deficiency Third degree cystocele with pessary use Family hx of colon cancer and pancreatic cancer  Lichen planus (vulv, mouth, lower extremity)  P:   Mammogram guidelines reviewed.  Doing yearly. pap smear obtained today Lab work done with Dr. Brigitte Pulse Trying to get Shingrix vaccination  return annually or prn

## 2017-02-24 LAB — CYTOLOGY - PAP: DIAGNOSIS: NEGATIVE

## 2017-03-06 DIAGNOSIS — E538 Deficiency of other specified B group vitamins: Secondary | ICD-10-CM | POA: Diagnosis not present

## 2017-04-03 DIAGNOSIS — E538 Deficiency of other specified B group vitamins: Secondary | ICD-10-CM | POA: Diagnosis not present

## 2017-04-24 DIAGNOSIS — J9801 Acute bronchospasm: Secondary | ICD-10-CM | POA: Diagnosis not present

## 2017-04-24 DIAGNOSIS — R05 Cough: Secondary | ICD-10-CM | POA: Diagnosis not present

## 2017-04-24 DIAGNOSIS — Z6820 Body mass index (BMI) 20.0-20.9, adult: Secondary | ICD-10-CM | POA: Diagnosis not present

## 2017-05-08 DIAGNOSIS — E538 Deficiency of other specified B group vitamins: Secondary | ICD-10-CM | POA: Diagnosis not present

## 2017-06-18 DIAGNOSIS — E538 Deficiency of other specified B group vitamins: Secondary | ICD-10-CM | POA: Diagnosis not present

## 2017-07-11 DIAGNOSIS — H5203 Hypermetropia, bilateral: Secondary | ICD-10-CM | POA: Diagnosis not present

## 2017-07-11 DIAGNOSIS — H25813 Combined forms of age-related cataract, bilateral: Secondary | ICD-10-CM | POA: Diagnosis not present

## 2017-07-11 DIAGNOSIS — H52223 Regular astigmatism, bilateral: Secondary | ICD-10-CM | POA: Diagnosis not present

## 2017-07-22 DIAGNOSIS — E538 Deficiency of other specified B group vitamins: Secondary | ICD-10-CM | POA: Diagnosis not present

## 2017-08-27 DIAGNOSIS — E7849 Other hyperlipidemia: Secondary | ICD-10-CM | POA: Diagnosis not present

## 2017-08-27 DIAGNOSIS — I10 Essential (primary) hypertension: Secondary | ICD-10-CM | POA: Diagnosis not present

## 2017-08-27 DIAGNOSIS — E538 Deficiency of other specified B group vitamins: Secondary | ICD-10-CM | POA: Diagnosis not present

## 2017-08-27 DIAGNOSIS — M859 Disorder of bone density and structure, unspecified: Secondary | ICD-10-CM | POA: Diagnosis not present

## 2017-08-27 DIAGNOSIS — R82998 Other abnormal findings in urine: Secondary | ICD-10-CM | POA: Diagnosis not present

## 2017-09-03 DIAGNOSIS — R1310 Dysphagia, unspecified: Secondary | ICD-10-CM | POA: Diagnosis not present

## 2017-09-03 DIAGNOSIS — Z0000 Encounter for general adult medical examination without abnormal findings: Secondary | ICD-10-CM | POA: Diagnosis not present

## 2017-09-03 DIAGNOSIS — M8580 Other specified disorders of bone density and structure, unspecified site: Secondary | ICD-10-CM | POA: Diagnosis not present

## 2017-09-03 DIAGNOSIS — G4700 Insomnia, unspecified: Secondary | ICD-10-CM | POA: Diagnosis not present

## 2017-09-03 DIAGNOSIS — J309 Allergic rhinitis, unspecified: Secondary | ICD-10-CM | POA: Diagnosis not present

## 2017-09-03 DIAGNOSIS — Z681 Body mass index (BMI) 19 or less, adult: Secondary | ICD-10-CM | POA: Diagnosis not present

## 2017-09-03 DIAGNOSIS — E538 Deficiency of other specified B group vitamins: Secondary | ICD-10-CM | POA: Diagnosis not present

## 2017-09-03 DIAGNOSIS — Z1389 Encounter for screening for other disorder: Secondary | ICD-10-CM | POA: Diagnosis not present

## 2017-09-03 DIAGNOSIS — E7849 Other hyperlipidemia: Secondary | ICD-10-CM | POA: Diagnosis not present

## 2017-09-03 DIAGNOSIS — I10 Essential (primary) hypertension: Secondary | ICD-10-CM | POA: Diagnosis not present

## 2017-09-03 DIAGNOSIS — G5601 Carpal tunnel syndrome, right upper limb: Secondary | ICD-10-CM | POA: Diagnosis not present

## 2017-09-03 DIAGNOSIS — Z23 Encounter for immunization: Secondary | ICD-10-CM | POA: Diagnosis not present

## 2017-10-02 DIAGNOSIS — E538 Deficiency of other specified B group vitamins: Secondary | ICD-10-CM | POA: Diagnosis not present

## 2017-10-02 DIAGNOSIS — Z1231 Encounter for screening mammogram for malignant neoplasm of breast: Secondary | ICD-10-CM | POA: Diagnosis not present

## 2017-10-22 ENCOUNTER — Encounter: Payer: Self-pay | Admitting: Obstetrics & Gynecology

## 2017-11-04 DIAGNOSIS — E538 Deficiency of other specified B group vitamins: Secondary | ICD-10-CM | POA: Diagnosis not present

## 2017-12-09 DIAGNOSIS — E538 Deficiency of other specified B group vitamins: Secondary | ICD-10-CM | POA: Diagnosis not present

## 2018-01-13 DIAGNOSIS — E538 Deficiency of other specified B group vitamins: Secondary | ICD-10-CM | POA: Diagnosis not present

## 2018-02-19 DIAGNOSIS — E538 Deficiency of other specified B group vitamins: Secondary | ICD-10-CM | POA: Diagnosis not present

## 2018-02-23 ENCOUNTER — Ambulatory Visit: Payer: Medicare Other | Admitting: Obstetrics & Gynecology

## 2018-02-26 ENCOUNTER — Other Ambulatory Visit: Payer: Self-pay

## 2018-02-26 ENCOUNTER — Encounter: Payer: Self-pay | Admitting: Obstetrics & Gynecology

## 2018-02-26 ENCOUNTER — Ambulatory Visit (INDEPENDENT_AMBULATORY_CARE_PROVIDER_SITE_OTHER): Payer: Medicare Other | Admitting: Obstetrics & Gynecology

## 2018-02-26 VITALS — BP 120/60 | HR 76 | Resp 16 | Ht 64.5 in | Wt 118.0 lb

## 2018-02-26 DIAGNOSIS — R339 Retention of urine, unspecified: Secondary | ICD-10-CM | POA: Diagnosis not present

## 2018-02-26 DIAGNOSIS — Z124 Encounter for screening for malignant neoplasm of cervix: Secondary | ICD-10-CM | POA: Diagnosis not present

## 2018-02-26 DIAGNOSIS — N898 Other specified noninflammatory disorders of vagina: Secondary | ICD-10-CM

## 2018-02-26 DIAGNOSIS — Z01419 Encounter for gynecological examination (general) (routine) without abnormal findings: Secondary | ICD-10-CM | POA: Diagnosis not present

## 2018-02-26 NOTE — Progress Notes (Signed)
73 y.o. G38P1001 Divorced White or Caucasian female here for annual exam.  Doing well.  Denies vaginal bleeding.   Energy is better.  She feels it is due to refined sugar.  This has really helped her joint pain as well.    Using pessary.  She will have some drainage with the pessary from time to time.    Lost her older brother in October due to a fall.  Younger brother with neuroendocrine cancer.  This is progressive and slow but very hard to watch.  This is so sad for her.    PCP:  Dr. Brigitte Pulse.     Patient's last menstrual period was 01/07/1998.          Sexually active: No.  The current method of family planning is post menopausal status.    Exercising: No.   Smoker:  no  Health Maintenance: Pap:  02/21/17 Neg   08/02/14 neg  History of abnormal Pap:  Yes, mild dysplasia  MMG:  10/02/17 BIRADS1:neg  Colonoscopy:  2016 f/u 5 years BMD:   2019, osteopenia.  Done with Dr. Brigitte Pulse. TDaP:  2012 Pneumonia vaccine(s):  PCP Shingrix: Completed  Hep C testing: 11/03/15 Neg  Screening Labs: PCP   reports that she has never smoked. She has never used smokeless tobacco. She reports current alcohol use of about 3.0 standard drinks of alcohol per week. She reports that she does not use drugs.  Past Medical History:  Diagnosis Date  . Abnormal Pap smear 1995   [place]  . 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 Diverticulitis   . Hyperlipidemia   . Hypertension    ? being tested  . Inflammatory polyps of colon with rectal bleeding ([place]) 12/96  . Lichen planus    genital,gums,legs,back  . Mitral valve prolapse   . Osteopenia   . Shingles 6/94  . Shoulder fracture, left   . Vitamin B12 deficiency 09/2016    Past Surgical History:  Procedure Laterality Date  . COLONOSCOPY  04/2014   Polyps - repeat 5 years - Dr. Oletta Lamas   . NOSE SURGERY     nasal repair  . SHOULDER ARTHROSCOPY Left 1998  . TONSILLECTOMY AND ADENOIDECTOMY     as child  . TOTAL HIP ARTHROPLASTY Left  2005    Current Outpatient Medications  Medication Sig Dispense Refill  . betamethasone dipropionate 0.05 % lotion as directed.     . Cholecalciferol (VITAMIN D3) 2000 units TABS Take by mouth.    . Clobetasol Propionate Emulsion 0.05 % topical foam Apply topically as needed.    . cyanocobalamin (,VITAMIN B-12,) 1000 MCG/ML injection Inject 1,000 mcg into the muscle every 30 (thirty) days.     . diclofenac sodium (VOLTAREN) 1 % GEL Apply topically 4 (four) times daily.    . fluticasone (FLONASE) 50 MCG/ACT nasal spray Place 1 spray into both nostrils 2 (two) times daily.  11  . loratadine (CLARITIN) 10 MG tablet Take 10 mg by mouth daily.    Marland Kitchen omeprazole (PRILOSEC) 40 MG capsule Take 40 mg by mouth daily.     . phenazopyridine (PYRIDIUM) 200 MG tablet Take 1 tablet (200 mg total) by mouth 3 (three) times daily as needed. 6 tablet 0  . prednisoLONE (PRELONE) 15 MG/5ML SOLN Take by mouth 2 (two) times daily.     Marland Kitchen tretinoin (RETIN-A) 0.05 % cream Apply topically at bedtime.     No current facility-administered medications for this visit.  Family History  Problem Relation Age of Onset  . Lung cancer Mother        deceased  . Lung cancer Father        deceased  . Cancer - Colon Maternal Grandmother   . Prostate cancer Brother   . Pancreatic cancer Brother     Review of Systems  HENT:       Gum disease   Gastrointestinal: Positive for nausea and vomiting.  Musculoskeletal: Positive for myalgias.  Psychiatric/Behavioral:       Depression   All other systems reviewed and are negative.   Exam:   BP 120/60 (BP Location: Right Arm, Patient Position: Sitting, Cuff Size: Normal)   Pulse 76   Resp 16   Ht 5' 4.5" (1.638 m)   Wt 118 lb (53.5 kg)   LMP 01/07/1998   BMI 19.94 kg/m   Height: 5' 4.5" (163.8 cm)  Ht Readings from Last 3 Encounters:  02/26/18 5' 4.5" (1.638 m)  02/21/17 5' 4.5" (1.638 m)  11/21/16 5' 2.5" (1.588 m)    General appearance: alert, cooperative and  appears stated age Head: Normocephalic, without obvious abnormality, atraumatic Neck: no adenopathy, supple, symmetrical, trachea midline and thyroid normal to inspection and palpation Lungs: clear to auscultation bilaterally Breasts: normal appearance, no masses or tenderness Heart: regular rate and rhythm Abdomen: soft, non-tender; bowel sounds normal; no masses,  no organomegaly Extremities: extremities normal, atraumatic, no cyanosis or edema Skin: Skin color, texture, turgor normal. No rashes or lesions Lymph nodes: Cervical, supraclavicular, and axillary nodes normal. No abnormal inguinal nodes palpated Neurologic: Grossly normal   Pelvic: External genitalia:  no lesions              Urethra:  normal appearing urethra with no masses, tenderness or lesions              Bartholins and Skenes: normal                 Vagina: normal appearing vagina with normal color and discharge, no lesions              Cervix: no lesions              Pap taken: No. Bimanual Exam:  Uterus:  normal size, contour, position, consistency, mobility, non-tender              Adnexa: normal adnexa and no mass, fullness, tenderness               Rectovaginal: Confirms               Anus:  normal sphincter tone, no lesions  Chaperone was present for exam.  A:  Well Woman with normal exam PMP, no HRT Fibromyalgia B12 deficiency 3rd degree cystocele with pessary use Grief reaction Family hx of colon and pancreatic cancer Lichen planus (vulvar, mouth, lower extremity) Vaginal odor, intermittent  P:   Mammogram guidelines reviewed pap smear neg 2019.  Not indicated today. Lab work UTD with Dr. Brigitte Pulse Information about therapists given Affirm pending Urine culture pending Return annually or prn

## 2018-02-27 DIAGNOSIS — R339 Retention of urine, unspecified: Secondary | ICD-10-CM | POA: Diagnosis not present

## 2018-02-27 LAB — VAGINITIS/VAGINOSIS, DNA PROBE
CANDIDA SPECIES: NEGATIVE
GARDNERELLA VAGINALIS: NEGATIVE
Trichomonas vaginosis: NEGATIVE

## 2018-02-28 LAB — URINE CULTURE

## 2018-03-25 DIAGNOSIS — E538 Deficiency of other specified B group vitamins: Secondary | ICD-10-CM | POA: Diagnosis not present

## 2018-04-28 DIAGNOSIS — E538 Deficiency of other specified B group vitamins: Secondary | ICD-10-CM | POA: Diagnosis not present

## 2018-06-04 DIAGNOSIS — E538 Deficiency of other specified B group vitamins: Secondary | ICD-10-CM | POA: Diagnosis not present

## 2018-07-07 DIAGNOSIS — E538 Deficiency of other specified B group vitamins: Secondary | ICD-10-CM | POA: Diagnosis not present

## 2018-07-08 DIAGNOSIS — H1859 Other hereditary corneal dystrophies: Secondary | ICD-10-CM | POA: Diagnosis not present

## 2018-07-08 DIAGNOSIS — H40059 Ocular hypertension, unspecified eye: Secondary | ICD-10-CM | POA: Diagnosis not present

## 2018-07-08 DIAGNOSIS — H1045 Other chronic allergic conjunctivitis: Secondary | ICD-10-CM | POA: Diagnosis not present

## 2018-07-08 DIAGNOSIS — H524 Presbyopia: Secondary | ICD-10-CM | POA: Diagnosis not present

## 2018-07-08 DIAGNOSIS — H5203 Hypermetropia, bilateral: Secondary | ICD-10-CM | POA: Diagnosis not present

## 2018-07-08 DIAGNOSIS — H52223 Regular astigmatism, bilateral: Secondary | ICD-10-CM | POA: Diagnosis not present

## 2018-08-06 DIAGNOSIS — E538 Deficiency of other specified B group vitamins: Secondary | ICD-10-CM | POA: Diagnosis not present

## 2018-09-02 DIAGNOSIS — E538 Deficiency of other specified B group vitamins: Secondary | ICD-10-CM | POA: Diagnosis not present

## 2018-09-02 DIAGNOSIS — Z23 Encounter for immunization: Secondary | ICD-10-CM | POA: Diagnosis not present

## 2018-09-02 DIAGNOSIS — E7849 Other hyperlipidemia: Secondary | ICD-10-CM | POA: Diagnosis not present

## 2018-09-02 DIAGNOSIS — M859 Disorder of bone density and structure, unspecified: Secondary | ICD-10-CM | POA: Diagnosis not present

## 2018-09-09 DIAGNOSIS — E538 Deficiency of other specified B group vitamins: Secondary | ICD-10-CM | POA: Diagnosis not present

## 2018-09-09 DIAGNOSIS — M859 Disorder of bone density and structure, unspecified: Secondary | ICD-10-CM | POA: Diagnosis not present

## 2018-09-09 DIAGNOSIS — Z86010 Personal history of colonic polyps: Secondary | ICD-10-CM | POA: Diagnosis not present

## 2018-09-09 DIAGNOSIS — E7849 Other hyperlipidemia: Secondary | ICD-10-CM | POA: Diagnosis not present

## 2018-09-09 DIAGNOSIS — F4321 Adjustment disorder with depressed mood: Secondary | ICD-10-CM | POA: Diagnosis not present

## 2018-09-09 DIAGNOSIS — Z0000 Encounter for general adult medical examination without abnormal findings: Secondary | ICD-10-CM | POA: Diagnosis not present

## 2018-09-09 DIAGNOSIS — G4700 Insomnia, unspecified: Secondary | ICD-10-CM | POA: Diagnosis not present

## 2018-09-09 DIAGNOSIS — J309 Allergic rhinitis, unspecified: Secondary | ICD-10-CM | POA: Diagnosis not present

## 2018-09-09 DIAGNOSIS — Z1331 Encounter for screening for depression: Secondary | ICD-10-CM | POA: Diagnosis not present

## 2018-09-09 DIAGNOSIS — I10 Essential (primary) hypertension: Secondary | ICD-10-CM | POA: Diagnosis not present

## 2018-09-09 DIAGNOSIS — R1310 Dysphagia, unspecified: Secondary | ICD-10-CM | POA: Diagnosis not present

## 2018-09-10 DIAGNOSIS — E538 Deficiency of other specified B group vitamins: Secondary | ICD-10-CM | POA: Diagnosis not present

## 2018-09-21 DIAGNOSIS — R82998 Other abnormal findings in urine: Secondary | ICD-10-CM | POA: Diagnosis not present

## 2018-09-21 DIAGNOSIS — I10 Essential (primary) hypertension: Secondary | ICD-10-CM | POA: Diagnosis not present

## 2018-09-25 DIAGNOSIS — Z1212 Encounter for screening for malignant neoplasm of rectum: Secondary | ICD-10-CM | POA: Diagnosis not present

## 2018-10-05 ENCOUNTER — Encounter: Payer: Self-pay | Admitting: Obstetrics & Gynecology

## 2018-10-05 DIAGNOSIS — Z1231 Encounter for screening mammogram for malignant neoplasm of breast: Secondary | ICD-10-CM | POA: Diagnosis not present

## 2018-10-13 DIAGNOSIS — E538 Deficiency of other specified B group vitamins: Secondary | ICD-10-CM | POA: Diagnosis not present

## 2018-11-17 DIAGNOSIS — E538 Deficiency of other specified B group vitamins: Secondary | ICD-10-CM | POA: Diagnosis not present

## 2019-01-26 DIAGNOSIS — E538 Deficiency of other specified B group vitamins: Secondary | ICD-10-CM | POA: Diagnosis not present

## 2019-01-29 ENCOUNTER — Ambulatory Visit: Payer: Medicare Other | Attending: Internal Medicine

## 2019-01-29 DIAGNOSIS — Z23 Encounter for immunization: Secondary | ICD-10-CM | POA: Insufficient documentation

## 2019-01-29 NOTE — Progress Notes (Signed)
   Covid-19 Vaccination Clinic  Name:  [name]    MRN: [id number] DOB: 28-Dec-1945  01/29/2019  [name] was observed post Covid-19 immunization for 15 minutes without incidence. She was provided with Vaccine Information Sheet and instruction to access the V-Safe system.   [name] was instructed to call 911 with any severe reactions post vaccine: Marland Kitchen Difficulty breathing  . Swelling of your face and throat  . A fast heartbeat  . A bad rash all over your body  . Dizziness and weakness    Immunizations Administered    Name Date Dose VIS Date Route   Pfizer COVID-19 Vaccine 01/29/2019  1:41 PM 0.3 mL 12/18/2018 Intramuscular   Manufacturer: [place]   Lot: [id number]   [place]: [id number]

## 2019-02-07 ENCOUNTER — Ambulatory Visit: Payer: Medicare Other

## 2019-02-15 ENCOUNTER — Ambulatory Visit: Payer: Medicare Other

## 2019-02-17 DIAGNOSIS — L299 Pruritus, unspecified: Secondary | ICD-10-CM | POA: Diagnosis not present

## 2019-02-17 DIAGNOSIS — M25552 Pain in left hip: Secondary | ICD-10-CM | POA: Diagnosis not present

## 2019-02-17 DIAGNOSIS — M549 Dorsalgia, unspecified: Secondary | ICD-10-CM | POA: Diagnosis not present

## 2019-02-18 ENCOUNTER — Ambulatory Visit: Payer: Medicare Other

## 2019-02-19 ENCOUNTER — Ambulatory Visit: Payer: Medicare Other | Attending: Internal Medicine

## 2019-02-19 DIAGNOSIS — Z23 Encounter for immunization: Secondary | ICD-10-CM | POA: Insufficient documentation

## 2019-02-19 NOTE — Progress Notes (Signed)
   Covid-19 Vaccination Clinic  Name:  [name]    MRN: [id number] DOB: 12-12-1945  02/19/2019  [name] was observed post Covid-19 immunization for 15 minutes without incidence. She was provided with Vaccine Information Sheet and instruction to access the V-Safe system.   [name] was instructed to call 911 with any severe reactions post vaccine: Marland Kitchen Difficulty breathing  . Swelling of your face and throat  . A fast heartbeat  . A bad rash all over your body  . Dizziness and weakness    Immunizations Administered    Name Date Dose VIS Date Route   Pfizer COVID-19 Vaccine 02/19/2019  1:04 PM 0.3 mL 12/18/2018 Intramuscular   Manufacturer: [place]   Lot: [id number]   [place]: [id number]

## 2019-03-02 DIAGNOSIS — E538 Deficiency of other specified B group vitamins: Secondary | ICD-10-CM | POA: Diagnosis not present

## 2019-03-08 ENCOUNTER — Ambulatory Visit (INDEPENDENT_AMBULATORY_CARE_PROVIDER_SITE_OTHER): Payer: Medicare Other

## 2019-03-08 ENCOUNTER — Encounter: Payer: Self-pay | Admitting: Orthopaedic Surgery

## 2019-03-08 ENCOUNTER — Other Ambulatory Visit: Payer: Self-pay

## 2019-03-08 ENCOUNTER — Ambulatory Visit (INDEPENDENT_AMBULATORY_CARE_PROVIDER_SITE_OTHER): Payer: Medicare Other | Admitting: Orthopaedic Surgery

## 2019-03-08 DIAGNOSIS — M25552 Pain in left hip: Secondary | ICD-10-CM

## 2019-03-08 NOTE — Progress Notes (Signed)
Office Visit Note   Patient: [name]           Date of Birth: 1945-07-21           MRN: [id number] Visit Date: 03/08/2019              Requested by: Marton Redwood, MD [address] [place],  [place] [address] PCP: Marton Redwood, MD   Assessment & Plan: Visit Diagnoses:  1. Pain in left hip     Plan: We will send her for aspiration and possible therapeutic injection left hip with Dr. Ernestina Patches.  See her back after the injection approximately 2 to 3 weeks.  Questions were encouraged and answered at length by Dr. Ninfa Linden and myself.  Follow-Up Instructions: Return 2 to 3 weeks after injection.   Orders:  Orders Placed This Encounter  Procedures  . XR HIP UNILAT W OR W/O PELVIS 1V LEFT   No orders of the defined types were placed in this encounter.     Procedures: No procedures performed   Clinical Data: No additional findings.   Subjective: Chief Complaint  Patient presents with  . Left Hip - Pain    HPI [name] is 74 year old female comes in today for left hip pain has been ongoing for about a year.  Pretty constant achy pain worse with activity.  Also notes decreased flexion of the left hip compared to the right.  Has discomfort with this.  Has problems going up and down stairs.  She states her pain can go from dull achy pain to 6 out of 10 pain at its worst.  Pain is in the groin area of the left hip.  Status post left total hip arthroplasty by Dr. With her 2005.  Has done well until recently.  She uses no assistive device to ambulate.  Review of Systems Negative for fevers chills shortness of breath or chest pain.  Please see HPI.  Objective: Vital Signs: LMP 01/07/1998   Physical Exam Constitutional:      Appearance: She is normal weight. She is not ill-appearing or diaphoretic.  Cardiovascular:     Pulses: Normal pulses.  Pulmonary:     Effort: Pulmonary effort is normal.  Neurological:     Mental Status: She is alert and oriented to person,  place, and time.  Psychiatric:        Mood and Affect: Mood normal.     Ortho Exam Bilateral hips excellent range of motion.  Slight discomfort with external rotation of the left hip.  Slight tenderness over left trochanteric region.  Also slight tenderness over the right trochanteric region.  Flexion of the left hip causes discomfort in the groin area.  Slight leg length discrepancy with the left lower leg slightly longer than the right.  Specialty Comments:  No specialty comments available.  Imaging: XR HIP UNILAT W OR W/O PELVIS 1V LEFT  Result Date: 03/08/2019 AP pelvis lateral view of the left hip: Hips well located.  No acute fractures.  Arthroplasty components are well seated.  Minimal eccentric wear.  No other acute findings.    PMFS History: Patient Active Problem List   Diagnosis Date Noted  . Lichen planus [id number]  . Dyspnea 08/06/2012  . Fatigue 08/06/2012  . MVP (mitral valve prolapse) 08/06/2012  . Cystocele 04/01/2012  . Rectocele 04/01/2012  . Uterine prolapse 04/01/2012  . Genuine stress incontinence, female 04/01/2012   Past Medical History:  Diagnosis Date  . Abnormal Pap smear 1995  [place]  . 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 Diverticulitis   . Hyperlipidemia   . Hypertension    ? being tested  . Inflammatory polyps of colon with rectal bleeding ([place]) 12/96  . Lichen planus    genital,gums,legs,back  . Mitral valve prolapse   . Osteopenia   . Shingles 6/94  . Shoulder fracture, left   . Vitamin B12 deficiency 09/2016    Family History  Problem Relation Age of Onset  . Lung cancer Mother        deceased  . Lung cancer Father        deceased  . Cancer - Colon Maternal Grandmother   . Prostate cancer Brother   . Pancreatic cancer Brother     Past Surgical History:  Procedure Laterality Date  . COLONOSCOPY  04/2014   Polyps - repeat 5 years - Dr. Oletta Lamas   . NOSE SURGERY     nasal repair  . SHOULDER  ARTHROSCOPY Left 1998  . TONSILLECTOMY AND ADENOIDECTOMY     as child  . TOTAL HIP ARTHROPLASTY Left 2005   Social History   Occupational History  . Occupation: Retired   Tobacco Use  . Smoking status: Never Smoker  . Smokeless tobacco: Never Used  Substance and Sexual Activity  . Alcohol use: Yes    Alcohol/week: 3.0 standard drinks    Types: 3 Standard drinks or equivalent per week  . Drug use: No  . Sexual activity: Not Currently    Partners: Male    Birth control/protection: Post-menopausal

## 2019-03-08 NOTE — Addendum Note (Signed)
Addended by: Michae Kava B on: 03/08/2019 02:42 PM   Modules accepted: Orders

## 2019-03-29 ENCOUNTER — Ambulatory Visit (INDEPENDENT_AMBULATORY_CARE_PROVIDER_SITE_OTHER): Payer: Medicare Other | Admitting: Physical Medicine and Rehabilitation

## 2019-03-29 ENCOUNTER — Ambulatory Visit: Payer: Self-pay

## 2019-03-29 ENCOUNTER — Other Ambulatory Visit: Payer: Self-pay

## 2019-03-29 ENCOUNTER — Encounter: Payer: Self-pay | Admitting: Physical Medicine and Rehabilitation

## 2019-03-29 DIAGNOSIS — M25552 Pain in left hip: Secondary | ICD-10-CM

## 2019-03-29 NOTE — Progress Notes (Signed)
 .  Numeric Pain Rating Scale and Functional Assessment Average Pain 8   In the last MONTH (on 0-10 scale) has pain interfered with the following?  1. General activity like being  able to carry out your everyday physical activities such as walking, climbing stairs, carrying groceries, or moving a chair?  Rating(6)   -Dye Allergies.  

## 2019-03-30 DIAGNOSIS — M25552 Pain in left hip: Secondary | ICD-10-CM | POA: Diagnosis not present

## 2019-03-30 MED ORDER — TRIAMCINOLONE ACETONIDE 40 MG/ML IJ SUSP
60.0000 mg | INTRAMUSCULAR | Status: AC | PRN
  Administered 2019-03-30: 60 mg via INTRA_ARTICULAR

## 2019-03-30 MED ORDER — BUPIVACAINE HCL 0.25 % IJ SOLN
4.0000 mL | INTRAMUSCULAR | Status: AC | PRN
  Administered 2019-03-30: 4 mL via INTRA_ARTICULAR

## 2019-03-30 NOTE — Progress Notes (Signed)
   [name] - 74 y.o. female MRN [id number]  Date of birth: 07-12-1945  Office Visit Note: Visit Date: 03/29/2019 PCP: Marton Redwood, MD Referred by: Marton Redwood, MD  Subjective: Chief Complaint  Patient presents with  . Left Hip - Pain   HPI:  [name] is a 74 y.o. female who comes in today At the request of Dr. Jean Rosenthal, M.D. and Benita Stabile, PA-C for diagnostic and hopefully therapeutic injection of left total hip arthroplasty.  Patient has been having pain in the left groin and hip for some time.  She has a remote arthroplasty of the left hip.  No signs or complaints of infection.  Further referring doctors request we will try to aspirate and if there is no aspirate we will provide injection with cortisone and Marcaine.  ROS Otherwise per HPI.  Assessment & Plan: Visit Diagnoses:  1. Pain in left hip     Plan: No additional findings.   Meds & Orders: No orders of the defined types were placed in this encounter.   Orders Placed This Encounter  Procedures  . Large Joint Inj  . XR C-ARM NO REPORT    Follow-up: Return in about 2 weeks (around 04/12/2019) for Benita Stabile, PA-C.   Procedures: Large Joint Inj: L hip joint on 03/30/2019 5:10 AM Indications: diagnostic evaluation and pain Details: 22 G 3.5 in needle, fluoroscopy-guided anterior approach  Arthrogram: No  Medications: 4 mL bupivacaine 0.25 %; 60 mg triamcinolone acetonide 40 MG/ML Outcome: tolerated well, no immediate complications  There was excellent flow of contrast producing a partial pseudo-arthrogram of the hip.  Aspiration did not yield any fluid.  The patient DID have relief of symptoms during the anesthetic phase of the injection. Procedure, treatment alternatives, risks and benefits explained, specific risks discussed. Consent was given by the patient. Immediately prior to procedure a time out was called to verify the correct patient, procedure, equipment, support staff and site/side  marked as required. Patient was prepped and draped in the usual sterile fashion.      No notes on file   Clinical History: No specialty comments available.     Objective:  VS:  HT:    WT:   BMI:     BP:   HR: bpm  TEMP: ( )  RESP:  Physical Exam  Ortho Exam Imaging: XR C-ARM NO REPORT  Result Date: 03/29/2019 Please see Notes tab for imaging impression.

## 2019-04-08 DIAGNOSIS — E538 Deficiency of other specified B group vitamins: Secondary | ICD-10-CM | POA: Diagnosis not present

## 2019-04-14 ENCOUNTER — Ambulatory Visit (INDEPENDENT_AMBULATORY_CARE_PROVIDER_SITE_OTHER): Payer: Medicare Other | Admitting: Orthopaedic Surgery

## 2019-04-14 ENCOUNTER — Other Ambulatory Visit: Payer: Self-pay

## 2019-04-14 ENCOUNTER — Encounter: Payer: Self-pay | Admitting: Orthopaedic Surgery

## 2019-04-14 DIAGNOSIS — M25552 Pain in left hip: Secondary | ICD-10-CM | POA: Diagnosis not present

## 2019-04-14 NOTE — Progress Notes (Signed)
The patient is a very pleasant 74 year old female who is returning for follow-up after having her left hip joint aspirated and a steroid injection placed under direct fluoroscopy by Dr. Ernestina Patches.  This was done on March 22.  She does have a history of a left total hip arthroplasty done through a posterior approach by Dr. Durward Fortes in 2005.  In 2014 she did have a MRI of the hip that showed a small fluid collection but then her pain resolved.  She then came just recently with left hip pain.  Her x-rays of the left hip shows a well-seated implant with no evidence of osteolysis or loosening or other complicating features.  I felt it was reasonable to send her to Dr. Ernestina Patches for an aspiration and injection.  She states that there was no fluid taken off her hip which was comforting to her in a steroid injection was applied.  She reports that she is doing much better.  She says that she has still just a little bit of discomfort in her left hip but the pain is not as severe.  She has a normal exam with her left hip today on my exam.  At this point she can follow-up as needed however, if she develops worsening hip pain again I would like her to call us because at that point a MRI of her left hip and a three-phase bone scan would be warranted.  All questions concerns were answered and addressed.

## 2019-05-11 DIAGNOSIS — E538 Deficiency of other specified B group vitamins: Secondary | ICD-10-CM | POA: Diagnosis not present

## 2019-05-13 DIAGNOSIS — Z1159 Encounter for screening for other viral diseases: Secondary | ICD-10-CM | POA: Diagnosis not present

## 2019-05-18 DIAGNOSIS — Z86010 Personal history of colonic polyps: Secondary | ICD-10-CM | POA: Diagnosis not present

## 2019-05-18 DIAGNOSIS — K5730 Diverticulosis of large intestine without perforation or abscess without bleeding: Secondary | ICD-10-CM | POA: Diagnosis not present

## 2019-05-18 DIAGNOSIS — D122 Benign neoplasm of ascending colon: Secondary | ICD-10-CM | POA: Diagnosis not present

## 2019-05-20 DIAGNOSIS — D122 Benign neoplasm of ascending colon: Secondary | ICD-10-CM | POA: Diagnosis not present

## 2019-06-11 ENCOUNTER — Telehealth: Payer: Self-pay

## 2019-06-11 NOTE — Telephone Encounter (Signed)
Left message to call Sharee Pimple, RN at [facility].   Last AEX 02/26/18 w/ Dr. Sabra Heck.

## 2019-06-11 NOTE — Telephone Encounter (Signed)
Patient is calling to schedule a consult appointment regarding bladder prolapse. Patient would like a mychart appointment if possible.

## 2019-06-11 NOTE — Telephone Encounter (Signed)
Spoke with patient. Patient reports lower left groin pain for several years, has been getting progressively worse, especially over the past 6 wks. Was seen by PCP, thought the pain may be related to previous left hip replacement, referred to ortho. Ortho did a Xray, "hip looked good". Was treated with cortisone injection that seem to help for 2-3 weeks. Hx of cystocele, uses a pessary. Has regular massages, in discussion with her massage therapist they discussed that the pain could be coming from the "TSL muscle" and possibly related to the cystocele.   Patient denies any other GYN symptoms such as pelvic pain, bleeding, vag odor or d/c.   Patient is requesting to further discuss with Dr. Sabra Heck. She is not interested in surgery at this time, her brother is currently in hospice care.   Last AEX 02/26/18.   Advised OV recommended, patient agreeable. OV scheduled for 6/15 at 4:30pm.   Routing to provider for final review. Patient is agreeable to disposition. Will close encounter.

## 2019-06-15 DIAGNOSIS — E538 Deficiency of other specified B group vitamins: Secondary | ICD-10-CM | POA: Diagnosis not present

## 2019-06-21 ENCOUNTER — Other Ambulatory Visit: Payer: Self-pay

## 2019-06-21 NOTE — Progress Notes (Signed)
GYNECOLOGY  VISIT  CC:   Uterine prolapse  HPI: 74 y.o. G74P1001 Divorced White or Caucasian female here for left groin pain, concerned it may be due to cystocele.  Feels cystocele is worsening and is considering proceeding with surgery.  She is not quite ready to proceed with surgery but wants to review options.  Also, wants to know if groin pain is related.  She reports seeing PCP, Dr. Brigitte Pulse, and discussing this.  He was concerned it could be her hip.  She had gotten to the point where the had to assist her left leg with her arms when getting into the car.  She saw Dr. Ninfa Linden.  xrays were normal.  She had steroid injection which lasted about a month.  It really did help but went away much faster than she expected.  She is aware cystoceles do not cause groin pain.  Denies vaginal bleeding or discharge.    GYNECOLOGIC HISTORY: Patient's last menstrual period was 01/07/1998. 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 being tested   Inflammatory polyps of colon with rectal bleeding ([place]) 70/62   Lichen planus    genital,gums,legs,back   Mitral valve prolapse    Osteopenia    Shingles 6/94   Shoulder fracture, left    Vitamin B12 deficiency 09/2016    Past Surgical History:  Procedure Laterality Date   COLONOSCOPY  04/2014   Polyps - repeat 5 years - Dr. Oletta Lamas    NOSE SURGERY     nasal repair   SHOULDER ARTHROSCOPY Left 1998   [place]     as child   TOTAL HIP ARTHROPLASTY Left 2005    MEDS:   Current Outpatient Medications on File Prior to Visit  Medication Sig Dispense Refill   Cholecalciferol (VITAMIN D3) 2000 units TABS Take by mouth.     cyanocobalamin (,VITAMIN B-12,) 1000 MCG/ML injection Inject 1,000 mcg into the muscle every 30 (thirty) days.      diclofenac sodium (VOLTAREN) 1 % GEL Apply topically 4 (four) times daily.     fluticasone (FLONASE) 50 MCG/ACT nasal spray Place 1 spray into both nostrils 2 (two) times daily.  11   loratadine (CLARITIN) 10 MG tablet Take 10 mg by mouth daily.     omeprazole (PRILOSEC) 40 MG capsule Take 40 mg by mouth daily.      prednisoLONE (PRELONE) 15 MG/5ML SOLN Take by mouth 2 (two) times daily.      No current facility-administered medications on file prior to visit.    ALLERGIES: Sulfa antibiotics, Aleve [naproxen sodium], Macrobid [nitrofurantoin macrocrystal], and Morphine and related  Family History  Problem Relation Age of Onset   Lung cancer Mother        deceased   Lung cancer Father        deceased   Cancer - Colon Maternal Grandmother    Prostate cancer Brother    Pancreatic cancer Brother  SH:  Divorced, non smoker  Review of Systems  Constitutional: Negative.   HENT: Negative.   Eyes: Negative.   Respiratory: Negative.   Cardiovascular: Negative.   Gastrointestinal: Negative.   Endocrine: Negative.   Genitourinary:       Left groin pain & bladder prolapse  Musculoskeletal: Negative.   Skin: Negative.   Allergic/Immunologic: Negative.   Neurological: Negative.   Hematological: Negative.   Psychiatric/Behavioral: Negative.     PHYSICAL EXAMINATION:    BP 124/80    Pulse 68    Temp 97.9 F (36.6 C) (Skin)    Resp 16    Wt 113 lb (51.3 kg)    LMP 01/07/1998    BMI 19.10 kg/m     General appearance: alert, cooperative and appears stated age Lymph:  no inguinal LAD noted, no mass in groin region on either  side  Pelvic: External genitalia:  no lesions              Urethra:  normal appearing urethra with no masses, tenderness or lesions              Bartholins and Skenes: normal                 Vagina: normal appearing vagina with normal color and discharge, no lesions, 3rd degree cystoele and second degree uterine prolaspe              Cervix: no lesions              Bimanual Exam:  Uterus:  normal size, contour, position, consistency, mobility, non-tender              Adnexa: no mass, fullness, tenderness   Pessary was replaced today.  Chaperone, Terence Lux, CMA, was present for exam.  Assessment: Left groin pain that resolved after steroid injection Incomplete uterine prolapse with cystocele Pessary use Vaginal atrophy  Plan: Will have pt return for PUS to ensure ovaries are normal Will start vaginal Vit E cream 1-2 grams pv twice weekly Pt likely needs to follow up with Dr. Ninfa Linden.  His note in April recommended she call if pain returned to proceed with MRi and three-phase bone scan. Will plan to refer to urogynecology later this year for surgical planning.   About 30 minutes spent with pt in total

## 2019-06-22 ENCOUNTER — Encounter: Payer: Self-pay | Admitting: Obstetrics & Gynecology

## 2019-06-22 ENCOUNTER — Ambulatory Visit (INDEPENDENT_AMBULATORY_CARE_PROVIDER_SITE_OTHER): Payer: Medicare Other | Admitting: Obstetrics & Gynecology

## 2019-06-22 VITALS — BP 124/80 | HR 68 | Temp 97.9°F | Resp 16 | Wt 113.0 lb

## 2019-06-22 DIAGNOSIS — R1032 Left lower quadrant pain: Secondary | ICD-10-CM

## 2019-06-22 DIAGNOSIS — N814 Uterovaginal prolapse, unspecified: Secondary | ICD-10-CM | POA: Diagnosis not present

## 2019-06-22 MED ORDER — NONFORMULARY OR COMPOUNDED ITEM: 3 refills | Status: AC

## 2019-06-23 ENCOUNTER — Telehealth: Payer: Self-pay | Admitting: Obstetrics & Gynecology

## 2019-06-23 NOTE — Telephone Encounter (Signed)
Spoke with patient regarding benefits for recommended ultrasound. Patient is aware that ultrasound is transvaginal. Patient acknowledges understanding of information presented. Patient is aware of cancellation policy. Patient scheduled appointment for 07/01/2019 at 0300PM with M. Edwinna Areola, MD. Encounter closed.

## 2019-07-01 ENCOUNTER — Other Ambulatory Visit: Payer: Self-pay

## 2019-07-01 ENCOUNTER — Ambulatory Visit (INDEPENDENT_AMBULATORY_CARE_PROVIDER_SITE_OTHER): Payer: Medicare Other

## 2019-07-01 ENCOUNTER — Ambulatory Visit (INDEPENDENT_AMBULATORY_CARE_PROVIDER_SITE_OTHER): Payer: Medicare Other | Admitting: Obstetrics & Gynecology

## 2019-07-01 ENCOUNTER — Encounter: Payer: Self-pay | Admitting: Obstetrics & Gynecology

## 2019-07-01 ENCOUNTER — Other Ambulatory Visit: Payer: Medicare Other | Admitting: Obstetrics & Gynecology

## 2019-07-01 ENCOUNTER — Other Ambulatory Visit: Payer: Medicare Other

## 2019-07-01 VITALS — BP 110/70 | HR 68 | Temp 97.7°F | Resp 16 | Wt 113.0 lb

## 2019-07-01 DIAGNOSIS — N814 Uterovaginal prolapse, unspecified: Secondary | ICD-10-CM

## 2019-07-01 DIAGNOSIS — N8110 Cystocele, unspecified: Secondary | ICD-10-CM | POA: Diagnosis not present

## 2019-07-01 DIAGNOSIS — R1032 Left lower quadrant pain: Secondary | ICD-10-CM | POA: Diagnosis not present

## 2019-07-01 NOTE — Progress Notes (Signed)
74 y.o. [id number] Divorced White or Caucasian female here for pelvic ultrasound due to left groin pain, ovarian cancer concerns.  She has incomplete uterine prolapse and cystocele.  She does have a hx of incomplete bladder emptying.  She does use a pessary.  She is contemplating surgery in the fall/winter.  Also having groin pain.  Has seen Dr. Durward Fortes and Dr. Ninfa Linden in the past for hip/groin pain issues.  Had steroid injection that helped for several weeks but not as long as she was advised it would help.  Is aware, she needs to have follow up for MRI and additional testing.  States she will call to schedule.    Patient's last menstrual period was 01/07/1998.  Contraception: PMP  Findings:  UTERUS: 4.5 x 2.4 x 2.0cm EMS: 2.80mm with small amount of endometrial fluid noted ADNEXA: Left ovary: 2.0 x 1.1 x 1.2cm       Right ovary:  2.3 x 0.9 x 1.2cm.  Both ovaries appear atrophic. CUL DE SAC: no free fluid  Discussion:  Ultrasonographer supervised.  Pt's PVR today was 11 cc's.  Pt wonders why so improved and this is likely due to more regular pessary use.  She is aware ovaries appear completely normal and this is not the cause of the groin pain.  She is going to follow up with ortho and will call.  She is interested in proceeding with urogyn evaluation but not right now due to brother in hospice.  She is aware that she will need to let me now when ready to proceed.  Will wait for urogyn to get to [place] and do local referral for her.  She knows this provider will be here in August/September time frame.  She is also aware I am happy to be a part of her surgery as well.  Assessment:  Incomplete uterine prolapse Cystocele  Groin pain that is not of gyn origin Pessary use  Plan:  Pt will call if want referral sooner than the later summer/early fall She is also going to call and follow up with ortho  About 20 minutes spent with pt after reviewing ultrasound images and reviewing results for discussing  of follow up needs and probable future surgical planning.

## 2019-07-13 ENCOUNTER — Ambulatory Visit: Payer: Medicare Other | Admitting: Obstetrics & Gynecology

## 2019-07-14 ENCOUNTER — Encounter: Payer: Self-pay | Admitting: Orthopaedic Surgery

## 2019-07-14 ENCOUNTER — Ambulatory Visit (INDEPENDENT_AMBULATORY_CARE_PROVIDER_SITE_OTHER): Payer: Medicare Other | Admitting: Orthopaedic Surgery

## 2019-07-14 ENCOUNTER — Other Ambulatory Visit: Payer: Self-pay

## 2019-07-14 VITALS — Ht 64.5 in | Wt 113.0 lb

## 2019-07-14 DIAGNOSIS — M25552 Pain in left hip: Secondary | ICD-10-CM | POA: Diagnosis not present

## 2019-07-14 MED ORDER — CELECOXIB 100 MG PO CAPS: 100.0000 mg | ORAL_CAPSULE | Freq: Two times a day (BID) | ORAL | 1 refills | Status: DC

## 2019-07-14 NOTE — Progress Notes (Signed)
Office Visit Note   Patient: [name]           Date of Birth: 1945/03/28           MRN: [id number] Visit Date: 07/14/2019              Requested by: Marton Redwood, MD [address] [place],  [place] [address] PCP: Marton Redwood, MD   Assessment & Plan: Visit Diagnoses:  1. Pain in left hip     Plan: [name] saw Dr. Ninfa Linden in April for evaluation of her left hip pain.  I performed a total hip replacement in 2005.  X-rays were perfectly normal.  Dr. Ninfa Linden had Dr. Ernestina Patches aspirate the left hip and inject cortisone.  Apparently there was no fluid for analysis.  She notes that it did make a difference but has had some recurrence of her pain.  She is obviously concerned about her discomfort.  She still remains quite active.  She has not had any history of injury or trauma.  She denies fever chills.  She does have history of uterine prolapse but her gynecologist does not think that is at all related.  She is not having any back pain.  I did review her x-rays and felt like the components are in excellent position.  There was no obvious wear of the polycomponent.  I think it is worth starting with an MRI of her left hip with the MARS technique.  She might be a candidate for a three-phase bone scan.  Discussed all the above with her and will proceed Follow-Up Instructions: Return After MRI scan left hip.   Orders:  Orders Placed This Encounter  Procedures  . MR Hip Left w/o contrast   No orders of the defined types were placed in this encounter.     Procedures: No procedures performed   Clinical Data: No additional findings.   Subjective: Chief Complaint  Patient presents with  . Left Hip - Pain  Patient presents today for left hip pain. She has been having left groin for quite some time. She saw Dr.Blackman in March of this year and had x-rays taken. He ordered a cortisone injection and it did help for a few weeks. She said that the x-rays were normal. She has a history of a  left hip arthroplasty in 2005 with Dr.Anderson Coppock. Occasionally her pain will radiate up and sometimes posteriorly. Her pain is not constant, but worse with activity and bending at her hip. She is not taking anything for pain.   HPI  Review of Systems   Objective: Vital Signs: Ht 5' 4.5" (1.638 m)   Wt 113 lb (51.3 kg)   LMP 01/07/1998   BMI 19.10 kg/m   Physical Exam Constitutional:      Appearance: She is well-developed.  Eyes:     Pupils: Pupils are equal, round, and reactive to light.  Pulmonary:     Effort: Pulmonary effort is normal.  Skin:    General: Skin is warm and dry.  Neurological:     Mental Status: She is alert and oriented to person, place, and time.  Psychiatric:        Behavior: Behavior normal.     Ortho Exam awake alert and oriented x3 and comfortable sitting.  Walks without a limp.  No pain with internal and external rotation of her left hip in the sitting position.  If she would extend her hip she had very mild mid left groin pain.  No percussible  back pain.  No pain along the lateral aspect of her hip.  Skin intact.  Motor and sensory exam intact  Specialty Comments:  No specialty comments available.  Imaging: No results found.   PMFS History: Patient Active Problem List   Diagnosis Date Noted  . Pain in left hip 07/14/2019  . Lichen planus 51/10/2109  . Dyspnea 08/06/2012  . Fatigue 08/06/2012  . MVP (mitral valve prolapse) 08/06/2012  . Cystocele 04/01/2012  . Rectocele 04/01/2012  . Uterine prolapse 04/01/2012  . Genuine stress incontinence, female 04/01/2012   Past Medical History:  Diagnosis Date  . Abnormal Pap smear 1995   ascus  . 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 Diverticulitis   . Hyperlipidemia   . Hypertension    ? being tested  . Inflammatory polyps of colon with rectal bleeding ([place]) 12/96  . Lichen planus    genital,gums,legs,back  . Mitral valve prolapse   . Osteopenia   . Shingles 6/94    . Shoulder fracture, left   . Vitamin B12 deficiency 09/2016    Family History  Problem Relation Age of Onset  . Lung cancer Mother        deceased  . Lung cancer Father        deceased  . Cancer - Colon Maternal Grandmother   . Prostate cancer Brother   . Pancreatic cancer Brother     Past Surgical History:  Procedure Laterality Date  . COLONOSCOPY  04/2014   Polyps - repeat 5 years - Dr. Oletta Lamas   . NOSE SURGERY     nasal repair  . SHOULDER ARTHROSCOPY Left 1998  . TONSILLECTOMY AND ADENOIDECTOMY     as child  . TOTAL HIP ARTHROPLASTY Left 2005   Social History   Occupational History  . Occupation: Retired   Tobacco Use  . Smoking status: Never Smoker  . Smokeless tobacco: Never Used  Vaping Use  . Vaping Use: Never used  Substance and Sexual Activity  . Alcohol use: Yes    Alcohol/week: 3.0 standard drinks    Types: 3 Standard drinks or equivalent per week  . Drug use: No  . Sexual activity: Not Currently    Partners: Male    Birth control/protection: Post-menopausal

## 2019-07-19 ENCOUNTER — Other Ambulatory Visit: Payer: Medicare Other

## 2019-07-21 DIAGNOSIS — E538 Deficiency of other specified B group vitamins: Secondary | ICD-10-CM | POA: Diagnosis not present

## 2019-08-13 ENCOUNTER — Other Ambulatory Visit: Payer: Self-pay

## 2019-08-13 ENCOUNTER — Ambulatory Visit
Admission: RE | Admit: 2019-08-13 | Discharge: 2019-08-13 | Disposition: A | Discharge status: Home / Self Care | Payer: Medicare Other | Source: Ambulatory Visit | Attending: Orthopaedic Surgery | Admitting: Orthopaedic Surgery

## 2019-08-13 DIAGNOSIS — Z96612 Presence of left artificial shoulder joint: Secondary | ICD-10-CM | POA: Diagnosis not present

## 2019-08-13 DIAGNOSIS — Z96642 Presence of left artificial hip joint: Secondary | ICD-10-CM | POA: Diagnosis not present

## 2019-08-13 DIAGNOSIS — M25552 Pain in left hip: Secondary | ICD-10-CM

## 2019-08-13 DIAGNOSIS — Z471 Aftercare following joint replacement surgery: Secondary | ICD-10-CM | POA: Diagnosis not present

## 2019-08-19 ENCOUNTER — Other Ambulatory Visit: Payer: Self-pay

## 2019-08-19 ENCOUNTER — Encounter: Payer: Self-pay | Admitting: Orthopaedic Surgery

## 2019-08-19 ENCOUNTER — Ambulatory Visit (INDEPENDENT_AMBULATORY_CARE_PROVIDER_SITE_OTHER): Payer: Medicare Other | Admitting: Orthopaedic Surgery

## 2019-08-19 VITALS — Ht 64.5 in | Wt 113.0 lb

## 2019-08-19 DIAGNOSIS — Z96642 Presence of left artificial hip joint: Secondary | ICD-10-CM

## 2019-08-19 DIAGNOSIS — M25552 Pain in left hip: Secondary | ICD-10-CM

## 2019-08-19 NOTE — Progress Notes (Signed)
Office Visit Note   Patient: [name]           Date of Birth: 12-Nov-1945           MRN: [id number] Visit Date: 08/19/2019              Requested by: Marton Redwood, MD [address] [place],  [place] [address] PCP: Marton Redwood, MD   Assessment & Plan: Visit Diagnoses:  1. Pain in left hip   2. Presence of left artificial hip joint     Plan: [name] had an MRI scan of her left hip which I reviewed with the radiologist.  These were also compared to the MRI scan performed of the left hip in 2014.  On the more recent MRI scan there was no evidence of any abnormality.  There was no fluid collection or evidence of muscle atrophy or problem with the prosthesis.  There did not appear to be any intra pelvic etiology to her pain.  Review of all of her scans and her x-rays and the fact that she had some relief for several weeks with a cortisone injection under her left hip I suspect that she has some mild wear of the polycomponent.  There is no evidence of of obvious debris or fluid collection or a debris mass.  I am going to order three-phase bone scan.  In summary I suspect her problem is related to the prosthesis with somewhere which may be 2 to 3 mm.  The issue would be whether or not pending bone scan if exploring the hip and revising the polyis going to be of any benefit  Follow-Up Instructions: Return After three-phase bone scan.   Orders:  Orders Placed This Encounter  Procedures  . NM Bone Scan 3 Phase Lower Extremity   No orders of the defined types were placed in this encounter.     Procedures: No procedures performed   Clinical Data: No additional findings.   Subjective: Chief Complaint  Patient presents with  . Left Hip - Follow-up    MRI review  Patient presents today for follow up on her left hip. She had an MRI on 08/13/2019, and is here today for those results. She states that her pain is worse since her last visit. Her pain is located in her groin and also in  her buttock today. Her hip feels weak. She has not been taking anything for pain. Her brother is in hospice and she has been spending a lot of time with him. She sits with him for hours at a time and thinks that maybe that is making it worse. Total hip replacement was in 2006.  She had some discomfort in 2014 had an MRI scan revealing some areas of muscle atrophy and even fluid collection.  That has all resolved on the scan that was just performed.  I reviewed that with the radiologist today.  There is no obvious pathology about the left hip  HPI  Review of Systems  Constitutional: Negative for fatigue.  HENT: Negative for ear pain.   Eyes: Negative for pain.  Respiratory: Negative for shortness of breath.   Cardiovascular: Negative for leg swelling.  Gastrointestinal: Negative for constipation and diarrhea.  Endocrine: Negative for cold intolerance and heat intolerance.  Genitourinary: Negative for difficulty urinating.  Musculoskeletal: Negative for joint swelling.  Skin: Negative for rash.  Allergic/Immunologic: Negative for food allergies.  Neurological: Positive for weakness.  Hematological: Does not bruise/bleed easily.  Psychiatric/Behavioral: Negative for sleep  disturbance.     Objective: Vital Signs: Ht 5' 4.5" (1.638 m)   Wt 113 lb (51.3 kg)   LMP 01/07/1998   BMI 19.10 kg/m   Physical Exam Constitutional:      Appearance: She is well-developed.  Eyes:     Pupils: Pupils are equal, round, and reactive to light.  Pulmonary:     Effort: Pulmonary effort is normal.  Skin:    General: Skin is warm and dry.  Neurological:     Mental Status: She is alert and oriented to person, place, and time.  Psychiatric:        Behavior: Behavior normal.     Ortho Exam awake alert and oriented x3.  Comfortable sitting.  A little discomfort in the area of her mid left groin with hip flexion but very minimal with internal and external rotation.  No crepitation.  No edema or skin  changes.  Neurologically intact.  Straight leg raise negative Specialty Comments:  No specialty comments available.  Imaging: No results found.   PMFS History: Patient Active Problem List   Diagnosis Date Noted  . Pain in left hip 07/14/2019  . Lichen planus 45/62/5638  . Dyspnea 08/06/2012  . Fatigue 08/06/2012  . MVP (mitral valve prolapse) 08/06/2012  . Cystocele 04/01/2012  . Rectocele 04/01/2012  . Uterine prolapse 04/01/2012  . Genuine stress incontinence, female 04/01/2012   Past Medical History:  Diagnosis Date  . Abnormal Pap smear 1995   ascus  . 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 Diverticulitis   . Hyperlipidemia   . Hypertension    ? being tested  . Inflammatory polyps of colon with rectal bleeding ([place]) 12/96  . Lichen planus    genital,gums,legs,back  . Mitral valve prolapse   . Osteopenia   . Shingles 6/94  . Shoulder fracture, left   . Vitamin B12 deficiency 09/2016    Family History  Problem Relation Age of Onset  . Lung cancer Mother        deceased  . Lung cancer Father        deceased  . Cancer - Colon Maternal Grandmother   . Prostate cancer Brother   . Pancreatic cancer Brother     Past Surgical History:  Procedure Laterality Date  . COLONOSCOPY  04/2014   Polyps - repeat 5 years - Dr. Oletta Lamas   . NOSE SURGERY     nasal repair  . SHOULDER ARTHROSCOPY Left 1998  . TONSILLECTOMY AND ADENOIDECTOMY     as child  . TOTAL HIP ARTHROPLASTY Left 2005   Social History   Occupational History  . Occupation: Retired   Tobacco Use  . Smoking status: Never Smoker  . Smokeless tobacco: Never Used  Vaping Use  . Vaping Use: Never used  Substance and Sexual Activity  . Alcohol use: Yes    Alcohol/week: 3.0 standard drinks    Types: 3 Standard drinks or equivalent per week  . Drug use: No  . Sexual activity: Not Currently    Partners: Male    Birth control/protection: Post-menopausal

## 2019-08-25 ENCOUNTER — Encounter (HOSPITAL_COMMUNITY)
Admission: RE | Admit: 2019-08-25 | Discharge: 2019-08-25 | Disposition: A | Discharge status: Home / Self Care | Payer: Medicare Other | Source: Ambulatory Visit | Attending: Orthopaedic Surgery | Admitting: Orthopaedic Surgery

## 2019-08-25 DIAGNOSIS — Z96642 Presence of left artificial hip joint: Secondary | ICD-10-CM | POA: Insufficient documentation

## 2019-08-25 DIAGNOSIS — Z471 Aftercare following joint replacement surgery: Secondary | ICD-10-CM | POA: Diagnosis not present

## 2019-08-25 DIAGNOSIS — M25552 Pain in left hip: Secondary | ICD-10-CM | POA: Diagnosis not present

## 2019-08-25 MED ORDER — TECHNETIUM TC 99M MEDRONATE IV KIT
20.6000 | PACK | Freq: Once | INTRAVENOUS | Status: AC | PRN
  Administered 2019-08-25: 20.6 via INTRAVENOUS

## 2019-08-26 ENCOUNTER — Other Ambulatory Visit: Payer: Self-pay

## 2019-08-26 ENCOUNTER — Ambulatory Visit
Admission: RE | Admit: 2019-08-26 | Discharge: 2019-08-26 | Disposition: A | Discharge status: Home / Self Care | Payer: Medicare Other | Source: Ambulatory Visit | Attending: Orthopaedic Surgery | Admitting: Orthopaedic Surgery

## 2019-08-26 DIAGNOSIS — G8929 Other chronic pain: Secondary | ICD-10-CM

## 2019-08-26 DIAGNOSIS — M48061 Spinal stenosis, lumbar region without neurogenic claudication: Secondary | ICD-10-CM | POA: Diagnosis not present

## 2019-08-26 DIAGNOSIS — M25552 Pain in left hip: Secondary | ICD-10-CM

## 2019-08-26 DIAGNOSIS — M545 Low back pain, unspecified: Secondary | ICD-10-CM

## 2019-09-02 DIAGNOSIS — E538 Deficiency of other specified B group vitamins: Secondary | ICD-10-CM | POA: Diagnosis not present

## 2019-09-08 ENCOUNTER — Ambulatory Visit (INDEPENDENT_AMBULATORY_CARE_PROVIDER_SITE_OTHER): Payer: Medicare Other | Admitting: Orthopaedic Surgery

## 2019-09-08 ENCOUNTER — Other Ambulatory Visit: Payer: Self-pay

## 2019-09-08 ENCOUNTER — Encounter: Payer: Self-pay | Admitting: Orthopaedic Surgery

## 2019-09-08 VITALS — Ht 64.5 in | Wt 113.0 lb

## 2019-09-08 DIAGNOSIS — M25552 Pain in left hip: Secondary | ICD-10-CM

## 2019-09-08 NOTE — Progress Notes (Signed)
Office Visit Note   Patient: [name]           Date of Birth: Feb 09, 1945           MRN: [id number] Visit Date: 09/08/2019              Requested by: Marton Redwood, MD [address] [place],  [place] [address] PCP: Marton Redwood, MD   Assessment & Plan: Visit Diagnoses:  1. Pain in left hip     Plan: Today Adlee has had extensive work-up in regards to the left hip pain.  She has had a three-phase bone scan that was unremarkable and an MRI scan of her left hip which was also relatively unremarkable.  There is no evidence of infection or loosening of her components.  There is no evidence of any abnormality surrounding her left hip.  She did have a cortisone injection in her left hip by Dr. Ernestina Patches months ago and that did provide some relief for about 2 months.  I ordered an MRI scan of her lumbar spine that revealed some lumbar spine degeneration but without significant stenosis.  There was a possibility of a left L5 radiculopathy based on a 6 mm synovial cyst projecting into the neuroforamen at L5-S1 B[place]net's pain seems to be related to active motion of her hip where she has some groin pain particularly with flexion.  Passively she had no pain with any motion.  There is some evidence of very mild polywear by plain film.  She also is concerned about pelvic prolapse has been diagnosed by her gynecologist where there is playing any part in her present discomfort.  After much discussion we have decided on exercises for her back and her hip that she will download from the computer.  We have also talked about a diagnostic and possibly therapeutic lumbar injection by Dr. Ernestina Patches.  She would like to think about that she is going to check back with her gynecologist regarding possible surgery for her bladder and pelvic prolapse.  Fortunately there did not appear to be any significant problems with that with her left hip.  I still wonder if some of her discomfort would be related to the 2 there is mild  polywear but there is nothing on MRI scan or bone scan to suggest that there is a real problem.  She will let me know regarding her next step.  She is helping with her brother who is terminally ill and is not a good time for her to proceed with any procedures at this point there was some mild lateral recess stenosis at L4-5 and L5-S1  Follow-Up Instructions: Return if symptoms worsen or fail to improve.   Orders:  No orders of the defined types were placed in this encounter.  No orders of the defined types were placed in this encounter.     Procedures: No procedures performed   Clinical Data: No additional findings.   Subjective: Chief Complaint  Patient presents with  . Left Hip - Follow-up    Review scans  Patient is here today for follow up on her left hip. She had a bone scan on 08/25/2019 and an MRI of her L-Spine on 08/26/2019. She is here today to discuss those results. No changes since her last visit.   HPI  Review of Systems   Objective: Vital Signs: Ht 5' 4.5" (1.638 m)   Wt 113 lb (51.3 kg)   LMP 01/07/1998   BMI 19.10 kg/m   Physical Exam  Constitutional:      Appearance: She is well-developed.  Eyes:     Pupils: Pupils are equal, round, and reactive to light.  Pulmonary:     Effort: Pulmonary effort is normal.  Skin:    General: Skin is warm and dry.  Neurological:     Mental Status: She is alert and oriented to person, place, and time.  Psychiatric:        Behavior: Behavior normal.     Ortho Exam awake alert and oriented x3.  Comfortable sitting.  Walks without a limp.  Painless range of motion of her left hip with internal and external rotation flexion or extension.  With active flexion of her left hip she had some mild groin pain in the further chief flexed more she had to hold her thigh to lift it any higher.  This certainly would suggest a problem with with your muscles but again nothing by any of the scans.  Neurologically intact  Specialty  Comments:  No specialty comments available.  Imaging: No results found.   PMFS History: Patient Active Problem List   Diagnosis Date Noted  . Pain in left hip 07/14/2019  . Lichen planus 32/35/5732  . Dyspnea 08/06/2012  . Fatigue 08/06/2012  . MVP (mitral valve prolapse) 08/06/2012  . Cystocele 04/01/2012  . Rectocele 04/01/2012  . Uterine prolapse 04/01/2012  . Genuine stress incontinence, female 04/01/2012   Past Medical History:  Diagnosis Date  . Abnormal Pap smear 1995   ascus  . 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 Diverticulitis   . Hyperlipidemia   . Hypertension    ? being tested  . Inflammatory polyps of colon with rectal bleeding ([place]) 12/96  . Lichen planus    genital,gums,legs,back  . Mitral valve prolapse   . Osteopenia   . Shingles 6/94  . Shoulder fracture, left   . Vitamin B12 deficiency 09/2016    Family History  Problem Relation Age of Onset  . Lung cancer Mother        deceased  . Lung cancer Father        deceased  . Cancer - Colon Maternal Grandmother   . Prostate cancer Brother   . Pancreatic cancer Brother     Past Surgical History:  Procedure Laterality Date  . COLONOSCOPY  04/2014   Polyps - repeat 5 years - Dr. Oletta Lamas   . NOSE SURGERY     nasal repair  . SHOULDER ARTHROSCOPY Left 1998  . TONSILLECTOMY AND ADENOIDECTOMY     as child  . TOTAL HIP ARTHROPLASTY Left 2005   Social History   Occupational History  . Occupation: Retired   Tobacco Use  . Smoking status: Never Smoker  . Smokeless tobacco: Never Used  Vaping Use  . Vaping Use: Never used  Substance and Sexual Activity  . Alcohol use: Yes    Alcohol/week: 3.0 standard drinks    Types: 3 Standard drinks or equivalent per week  . Drug use: No  . Sexual activity: Not Currently    Partners: Male    Birth control/protection: Post-menopausal

## 2019-09-30 DIAGNOSIS — E785 Hyperlipidemia, unspecified: Secondary | ICD-10-CM | POA: Diagnosis not present

## 2019-09-30 DIAGNOSIS — M859 Disorder of bone density and structure, unspecified: Secondary | ICD-10-CM | POA: Diagnosis not present

## 2019-09-30 DIAGNOSIS — E538 Deficiency of other specified B group vitamins: Secondary | ICD-10-CM | POA: Diagnosis not present

## 2019-10-07 DIAGNOSIS — I341 Nonrheumatic mitral (valve) prolapse: Secondary | ICD-10-CM | POA: Diagnosis not present

## 2019-10-07 DIAGNOSIS — I10 Essential (primary) hypertension: Secondary | ICD-10-CM | POA: Diagnosis not present

## 2019-10-07 DIAGNOSIS — Z8719 Personal history of other diseases of the digestive system: Secondary | ICD-10-CM | POA: Diagnosis not present

## 2019-10-07 DIAGNOSIS — E538 Deficiency of other specified B group vitamins: Secondary | ICD-10-CM | POA: Diagnosis not present

## 2019-10-07 DIAGNOSIS — Z23 Encounter for immunization: Secondary | ICD-10-CM | POA: Diagnosis not present

## 2019-10-07 DIAGNOSIS — Z1339 Encounter for screening examination for other mental health and behavioral disorders: Secondary | ICD-10-CM | POA: Diagnosis not present

## 2019-10-07 DIAGNOSIS — R1310 Dysphagia, unspecified: Secondary | ICD-10-CM | POA: Diagnosis not present

## 2019-10-07 DIAGNOSIS — M859 Disorder of bone density and structure, unspecified: Secondary | ICD-10-CM | POA: Diagnosis not present

## 2019-10-07 DIAGNOSIS — F4321 Adjustment disorder with depressed mood: Secondary | ICD-10-CM | POA: Diagnosis not present

## 2019-10-07 DIAGNOSIS — Z1331 Encounter for screening for depression: Secondary | ICD-10-CM | POA: Diagnosis not present

## 2019-10-07 DIAGNOSIS — R82998 Other abnormal findings in urine: Secondary | ICD-10-CM | POA: Diagnosis not present

## 2019-10-07 DIAGNOSIS — M25552 Pain in left hip: Secondary | ICD-10-CM | POA: Diagnosis not present

## 2019-10-07 DIAGNOSIS — E785 Hyperlipidemia, unspecified: Secondary | ICD-10-CM | POA: Diagnosis not present

## 2019-10-07 DIAGNOSIS — Z0000 Encounter for general adult medical examination without abnormal findings: Secondary | ICD-10-CM | POA: Diagnosis not present

## 2019-10-18 ENCOUNTER — Other Ambulatory Visit: Payer: Self-pay | Admitting: Gastroenterology

## 2019-10-18 DIAGNOSIS — R1032 Left lower quadrant pain: Secondary | ICD-10-CM | POA: Diagnosis not present

## 2019-10-18 DIAGNOSIS — R1311 Dysphagia, oral phase: Secondary | ICD-10-CM

## 2019-10-26 ENCOUNTER — Ambulatory Visit
Admission: RE | Admit: 2019-10-26 | Discharge: 2019-10-26 | Disposition: A | Discharge status: Home / Self Care | Payer: Medicare Other | Source: Ambulatory Visit | Attending: Gastroenterology | Admitting: Gastroenterology

## 2019-10-26 DIAGNOSIS — R1311 Dysphagia, oral phase: Secondary | ICD-10-CM

## 2019-10-26 DIAGNOSIS — K225 Diverticulum of esophagus, acquired: Secondary | ICD-10-CM | POA: Diagnosis not present

## 2019-10-30 DIAGNOSIS — Z23 Encounter for immunization: Secondary | ICD-10-CM | POA: Diagnosis not present

## 2019-11-01 ENCOUNTER — Encounter: Payer: Self-pay | Admitting: Obstetrics and Gynecology

## 2019-11-01 ENCOUNTER — Ambulatory Visit (INDEPENDENT_AMBULATORY_CARE_PROVIDER_SITE_OTHER): Payer: Medicare Other | Admitting: Obstetrics and Gynecology

## 2019-11-01 ENCOUNTER — Other Ambulatory Visit: Payer: Self-pay

## 2019-11-01 VITALS — BP 123/74 | HR 71 | Temp 98.0°F | Ht 64.5 in | Wt 116.5 lb

## 2019-11-01 DIAGNOSIS — M62838 Other muscle spasm: Secondary | ICD-10-CM | POA: Diagnosis not present

## 2019-11-01 DIAGNOSIS — N816 Rectocele: Secondary | ICD-10-CM | POA: Diagnosis not present

## 2019-11-01 DIAGNOSIS — N3943 Post-void dribbling: Secondary | ICD-10-CM | POA: Diagnosis not present

## 2019-11-01 DIAGNOSIS — R351 Nocturia: Secondary | ICD-10-CM | POA: Diagnosis not present

## 2019-11-01 DIAGNOSIS — N812 Incomplete uterovaginal prolapse: Secondary | ICD-10-CM | POA: Diagnosis not present

## 2019-11-01 DIAGNOSIS — N8110 Cystocele, unspecified: Secondary | ICD-10-CM

## 2019-11-01 NOTE — Progress Notes (Signed)
[facility] New Patient Evaluation and Consultation  Referring Provider: Megan Salon, MD PCP: Marton Redwood, MD Date of Service: 11/01/2019  SUBJECTIVE Chief Complaint: Vaginal Prolapse  History of Present Illness: [name] is a 74 y.o. White or Caucasian female seen in consultation at the request of Dr. Sabra Heck for evaluation of prolapse.    Review of records significant for: History of prolapse with pessary use. Also has history of incomplete bladder emptying which has improved.   Prior treatment: pelvic floor PT, pessary  Urinary Symptoms: Leaks urine with after urination, usually just a dribble Leaks a few time(s) per days.  Pad use: 1 liners/ mini-pads per day.   She is not bothered by her UI symptoms. Drinks: 1 cup coffee,  Water, glass wine/ beer 3x week  Day time voids 6.  Nocturia: 1-2 times per night to void. Voiding dysfunction: she empties her bladder well.  does not use a catheter to empty bladder.  When urinating, she feels dribbling after finishing  UTIs: 0 UTI's in the last year.   Denies history of blood in urine and kidney or bladder stones  Pelvic Organ Prolapse Symptoms:                  She Admits to a feeling of a bulge the vaginal area. It has been present for 6 years.  She Denies seeing a bulge.  This bulge is bothersome. Has been using a pessary for 4-5 years.   Bowel Symptom: Bowel movements: 1 time(s) per day Stool consistency: soft  Straining: no.  Splinting: no.  Incomplete evacuation: no.  She Denies accidental bowel leakage / fecal incontinence Bowel regimen: diet Last colonoscopy: Date 2021, Results polyps  Sexual Function Sexually active: no.  Sexual orientation: Straight  Pelvic Pain Denies pelvic pain   Past Medical History:  Past Medical History:  Diagnosis Date  . Abnormal Pap smear 1995   [place]  . 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 Diverticulitis   . Hyperlipidemia   .  Hypertension    ? being tested  . Inflammatory polyps of colon with rectal bleeding ([place]) 12/96  . Lichen planus    genital,gums,legs,back  . Mitral valve prolapse   . Osteopenia   . Shingles 6/94  . Shoulder fracture, left   . Vitamin B12 deficiency 09/2016     Past Surgical History:   Past Surgical History:  Procedure Laterality Date  . COLONOSCOPY  04/2014   Polyps - repeat 5 years - Dr. Oletta Lamas   . NOSE SURGERY     nasal repair  . SHOULDER ARTHROSCOPY Left 1998  . TONSILLECTOMY AND ADENOIDECTOMY     as child  . TOTAL HIP ARTHROPLASTY Left 2005     Past OB/GYN History: G1 P1 Vaginal deliveries: 1, Forceps/ Vacuum deliveries: 0, Cesarean section: 0 Menopausal: Yes, at age 74 Contraception: n/a. Last pap smear was unsure.  Any history of abnormal pap smears: no.   Medications: She has a current medication list which includes the following prescription(s): vitamin d3, cyanocobalamin, diclofenac sodium, fluticasone, loratadine, NONFORMULARY OR COMPOUNDED ITEM, prednisolone, celecoxib, and omeprazole.   Allergies: Patient is allergic to sulfa antibiotics, aleve [naproxen sodium], macrobid [nitrofurantoin macrocrystal], and morphine and related.   Social History:  Social History   Tobacco Use  . Smoking status: Never Smoker  . Smokeless tobacco: Never Used  Vaping Use  . Vaping Use: Never used  Substance Use Topics  . Alcohol  use: Yes    Alcohol/week: 3.0 standard drinks    Types: 3 Standard drinks or equivalent per week  . Drug use: No    Relationship status: divorced She lives alone.   She is employed as a Marketing executive. Regular exercise: No History of abuse: No  Family History:   Family History  Problem Relation Age of Onset  . Lung cancer Mother        deceased  . Lung cancer Father        deceased  . Cancer - Colon Maternal Grandmother   . Prostate cancer Brother   . Pancreatic cancer Brother      Review of Systems: Review of Systems   Constitutional: Negative for fever, malaise/fatigue and weight loss.  Respiratory: Negative for cough, shortness of breath and wheezing.   Cardiovascular: Negative for chest pain and palpitations.  Gastrointestinal: Negative for abdominal pain and blood in stool.  Musculoskeletal: Positive for myalgias.  Skin: Negative for rash.  Neurological: Negative for dizziness and headaches.  Endo/Heme/Allergies: Does not bruise/bleed easily.  Psychiatric/Behavioral: Negative for depression. The patient is not nervous/anxious.      OBJECTIVE Physical Exam: Vitals:   11/01/19 1409  BP: 123/74  Pulse: 71  Temp: 98 F (36.7 C)  Weight: 116 lb 8 oz (52.8 kg)  Height: 5' 4.5" (1.638 m)    Physical Exam Constitutional:      General: She is not in acute distress. Pulmonary:     Effort: Pulmonary effort is normal.  Abdominal:     General: There is no distension.     Palpations: Abdomen is soft.     Tenderness: There is no abdominal tenderness. There is no rebound.  Musculoskeletal:        General: No swelling. Normal range of motion.  Skin:    General: Skin is warm and dry.     Findings: No rash.  Neurological:     Mental Status: She is alert and oriented to person, place, and time.  Psychiatric:        Mood and Affect: Mood normal.        Behavior: Behavior normal.      GU / Detailed Urogynecologic Evaluation:  Pelvic Exam: Normal external female genitalia; Bartholin's and Skene's glands normal in appearance; urethral meatus normal in appearance with caruncle, no urethral masses or discharge.   CST: negative  Speculum exam reveals normal vaginal mucosa with atrophy. Cervix normal appearance. Uterus normal single, nontender. Adnexa no mass, fullness, tenderness.    With apex supported, anterior compartment defect was reduced  Pelvic floor strength II/V, puborectalis IV/V external anal sphincter IV/V  Pelvic floor musculature: Right levator tender, Right obturator tender, Left  levator non-tender, Left obturator non-tender  POP-Q:   POP-Q  0                                            Aa   0                                           Ba  -5  C   3                                            Gh  4.5                                            Pb  7                                            tvl   -2                                            Ap  -2                                            Bp  -6                                              D     Rectal Exam:  Normal sphincter tone, moderate distal rectocele, enterocoele not present, no rectal masses, no sign of dyssynergia when asking the patient to bear down.  Post-Void Residual (PVR) by Bladder Scan: In order to evaluate bladder emptying, we discussed obtaining a postvoid residual and she agreed to this procedure. Patient was unable to void to give a urine sample.  Procedure: The ultrasound unit was placed on the patient's abdomen in the suprapubic region after the patient had voided. A PVR of 35 ml was obtained by bladder scan.   ASSESSMENT AND PLAN [name] is a 74 y.o. with:  1. Uterovaginal prolapse, incomplete   2. Prolapse of anterior vaginal wall   3. Prolapse of posterior vaginal wall   4. Post-void dribbling   5. Nocturia   6. Levator spasm    1.  Prolapse- Stage II anterior, stage I posterior, stage I apical  - For treatment of pelvic organ prolapse, we discussed options for management including expectant management, conservative management, and surgical management, such as Kegels, a pessary, pelvic floor physical therapy, and specific surgical procedures. - She is unsure she is interested in surgery at this time, overall is happy with her pessary. However, she potentially is interested in Sacrospinous hysteropexy, anterior and posterior repair. Reading material provided. She will call the office if she is interested in proceeding and  will need urodynamic testing prior.   2. Post void dribbling - negative CST on exam today - was unable to give urine sample today but had low volume on bladder scan - If she is interested in surgery will need UDS to further assess for stress incontinence.  3. Nocturia - discussed limiting fluids, especially alcohol before bedtime  4. Levator spasm - The origin of pelvic floor muscle spasm can be multifactorial, including primary, reactive to a different pain source,  trauma, or even part of a centralized pain syndrome.Treatment options include pelvic floor physical therapy, local (vaginal) or oral  muscle relaxants, or centrally acting pain medications.   - Pain is not associated with her hip pain that she is experiencing currently on the left side so she is choosing to defer management.   All questions answered. Return as needed or for urodynamic testing if she decides to proceed with surgery.   Jaquita Folds, MD   Medical Decision Making:  - Review and summation of prior records

## 2019-11-01 NOTE — Patient Instructions (Signed)
You have a stage 2 (out of 4) prolapse.  We discussed the fact that it is not life threatening but there are several treatment options. For treatment of pelvic organ prolapse, we discussed options for management including expectant management, conservative management, and surgical management, such as Kegels, a pessary, pelvic floor physical therapy, and specific surgical procedures (handouts provided).   Call the office if you would like to proceed with surgery to schedule urodynamic testing.

## 2019-11-16 DIAGNOSIS — E538 Deficiency of other specified B group vitamins: Secondary | ICD-10-CM | POA: Diagnosis not present

## 2019-11-18 DIAGNOSIS — K225 Diverticulum of esophagus, acquired: Secondary | ICD-10-CM | POA: Diagnosis not present

## 2019-11-18 DIAGNOSIS — R1310 Dysphagia, unspecified: Secondary | ICD-10-CM | POA: Diagnosis not present

## 2019-11-24 ENCOUNTER — Encounter: Payer: Self-pay | Admitting: Obstetrics & Gynecology

## 2019-11-26 DIAGNOSIS — Z1212 Encounter for screening for malignant neoplasm of rectum: Secondary | ICD-10-CM | POA: Diagnosis not present

## 2019-12-23 DIAGNOSIS — E538 Deficiency of other specified B group vitamins: Secondary | ICD-10-CM | POA: Diagnosis not present

## 2020-01-18 DIAGNOSIS — H43393 Other vitreous opacities, bilateral: Secondary | ICD-10-CM | POA: Diagnosis not present

## 2020-01-18 DIAGNOSIS — D3132 Benign neoplasm of left choroid: Secondary | ICD-10-CM | POA: Diagnosis not present

## 2020-01-18 DIAGNOSIS — H5203 Hypermetropia, bilateral: Secondary | ICD-10-CM | POA: Diagnosis not present

## 2020-01-18 DIAGNOSIS — H524 Presbyopia: Secondary | ICD-10-CM | POA: Diagnosis not present

## 2020-01-18 DIAGNOSIS — H3530 Unspecified macular degeneration: Secondary | ICD-10-CM | POA: Diagnosis not present

## 2020-01-18 DIAGNOSIS — H2513 Age-related nuclear cataract, bilateral: Secondary | ICD-10-CM | POA: Diagnosis not present

## 2020-01-18 DIAGNOSIS — H52223 Regular astigmatism, bilateral: Secondary | ICD-10-CM | POA: Diagnosis not present

## 2020-01-27 DIAGNOSIS — E538 Deficiency of other specified B group vitamins: Secondary | ICD-10-CM | POA: Diagnosis not present

## 2020-02-29 DIAGNOSIS — H25043 Posterior subcapsular polar age-related cataract, bilateral: Secondary | ICD-10-CM | POA: Diagnosis not present

## 2020-02-29 DIAGNOSIS — H2513 Age-related nuclear cataract, bilateral: Secondary | ICD-10-CM | POA: Diagnosis not present

## 2020-02-29 DIAGNOSIS — H18413 Arcus senilis, bilateral: Secondary | ICD-10-CM | POA: Diagnosis not present

## 2020-02-29 DIAGNOSIS — H25013 Cortical age-related cataract, bilateral: Secondary | ICD-10-CM | POA: Diagnosis not present

## 2020-02-29 DIAGNOSIS — H18593 Other hereditary corneal dystrophies, bilateral: Secondary | ICD-10-CM | POA: Diagnosis not present

## 2020-02-29 DIAGNOSIS — H2512 Age-related nuclear cataract, left eye: Secondary | ICD-10-CM | POA: Diagnosis not present

## 2020-03-08 DIAGNOSIS — E538 Deficiency of other specified B group vitamins: Secondary | ICD-10-CM | POA: Diagnosis not present

## 2020-03-20 DIAGNOSIS — Z03818 Encounter for observation for suspected exposure to other biological agents ruled out: Secondary | ICD-10-CM | POA: Diagnosis not present

## 2020-03-20 DIAGNOSIS — Z20822 Contact with and (suspected) exposure to covid-19: Secondary | ICD-10-CM | POA: Diagnosis not present

## 2020-04-10 DIAGNOSIS — H2513 Age-related nuclear cataract, bilateral: Secondary | ICD-10-CM | POA: Diagnosis not present

## 2020-04-10 DIAGNOSIS — H25013 Cortical age-related cataract, bilateral: Secondary | ICD-10-CM | POA: Diagnosis not present

## 2020-04-10 DIAGNOSIS — H18413 Arcus senilis, bilateral: Secondary | ICD-10-CM | POA: Diagnosis not present

## 2020-04-10 DIAGNOSIS — H18593 Other hereditary corneal dystrophies, bilateral: Secondary | ICD-10-CM | POA: Diagnosis not present

## 2020-04-12 DIAGNOSIS — E538 Deficiency of other specified B group vitamins: Secondary | ICD-10-CM | POA: Diagnosis not present

## 2020-04-14 ENCOUNTER — Ambulatory Visit: Payer: Medicare Other

## 2020-04-21 DIAGNOSIS — H2511 Age-related nuclear cataract, right eye: Secondary | ICD-10-CM | POA: Diagnosis not present

## 2020-04-21 DIAGNOSIS — H2512 Age-related nuclear cataract, left eye: Secondary | ICD-10-CM | POA: Diagnosis not present

## 2020-05-09 ENCOUNTER — Ambulatory Visit (HOSPITAL_BASED_OUTPATIENT_CLINIC_OR_DEPARTMENT_OTHER): Payer: Medicare Other | Admitting: Obstetrics & Gynecology

## 2020-05-10 ENCOUNTER — Other Ambulatory Visit: Payer: Self-pay

## 2020-05-10 ENCOUNTER — Encounter (HOSPITAL_BASED_OUTPATIENT_CLINIC_OR_DEPARTMENT_OTHER): Payer: Self-pay | Admitting: Obstetrics & Gynecology

## 2020-05-10 ENCOUNTER — Other Ambulatory Visit (HOSPITAL_COMMUNITY)
Admission: RE | Admit: 2020-05-10 | Discharge: 2020-05-10 | Disposition: A | Discharge status: Home / Self Care | Payer: Medicare Other | Source: Ambulatory Visit | Attending: Obstetrics & Gynecology | Admitting: Obstetrics & Gynecology

## 2020-05-10 ENCOUNTER — Ambulatory Visit (INDEPENDENT_AMBULATORY_CARE_PROVIDER_SITE_OTHER): Payer: Medicare Other | Admitting: Obstetrics & Gynecology

## 2020-05-10 VITALS — BP 135/73 | HR 57 | Ht 64.5 in | Wt 109.8 lb

## 2020-05-10 DIAGNOSIS — Z01411 Encounter for gynecological examination (general) (routine) with abnormal findings: Secondary | ICD-10-CM

## 2020-05-10 DIAGNOSIS — N3943 Post-void dribbling: Secondary | ICD-10-CM

## 2020-05-10 DIAGNOSIS — Z124 Encounter for screening for malignant neoplasm of cervix: Secondary | ICD-10-CM

## 2020-05-10 DIAGNOSIS — L439 Lichen planus, unspecified: Secondary | ICD-10-CM

## 2020-05-10 DIAGNOSIS — Z4689 Encounter for fitting and adjustment of other specified devices: Secondary | ICD-10-CM | POA: Diagnosis not present

## 2020-05-10 DIAGNOSIS — Z780 Asymptomatic menopausal state: Secondary | ICD-10-CM

## 2020-05-10 DIAGNOSIS — N8110 Cystocele, unspecified: Secondary | ICD-10-CM | POA: Diagnosis not present

## 2020-05-10 MED ORDER — CLOBETASOL PROPIONATE 0.05 % EX OINT: 1.0000 "application " | TOPICAL_OINTMENT | Freq: Two times a day (BID) | CUTANEOUS | 1 refills | Status: AC

## 2020-05-10 NOTE — Progress Notes (Signed)
75 y.o. [id number] Divorced White or Caucasian female here for breast and pelvic exam.  Doing well.  In the middle of cataract removal.  Second surgery is Friday.  Has a cystocele and is using pessary.  Saw Dr. Wannetta Sender.  Decided not to proceed after seeing her.  Would prefer another referral if starts thinking about this more.  Does have post void dribbling.  Has lost two brothers in the past two years so not really interested in surgery right now.  Reports having some issues with swallowing last year and was diagnosed with Zenker's diverticulum.  Seeing Dr. Rowe Clack at [facility].  Has follow up in September.  Denies vaginal bleeding.  Patient's last menstrual period was 01/07/1998.          Sexually active: No.  H/O STD:  no  Health Maintenance: PCP:  Dr. Brigitte Pulse.  Last wellness appt was 11/2019.  Did blood work at that appt:  yes Vaccines are up to date:  yes Colonoscopy:  With Dr. Watt Climes.  Will have pt sign release. MMG:  11/2019 per pt.  Will call and get copy as do not have this in chart. BMD:  With Dr. Brigitte Pulse Last pap smear:  2019.   H/o abnormal pap smear:      reports that she has never smoked. She has never used smokeless tobacco. She reports current alcohol use of about 3.0 standard drinks of alcohol per week. She reports that she does not use drugs.  Past Medical History:  Diagnosis Date  . Abnormal Pap smear 1995   [place]  . 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 Diverticulitis   . Hyperlipidemia   . Hypertension    ? being tested  . Inflammatory polyps of colon with rectal bleeding ([place]) 12/96  . Lichen planus    genital,gums,legs,back  . Mitral valve prolapse   . Osteopenia   . Shingles 6/94  . Shoulder fracture, left   . Vitamin B12 deficiency 09/2016    Past Surgical History:  Procedure Laterality Date  . COLONOSCOPY  04/2014   Polyps - repeat 5 years - Dr. Oletta Lamas   . NOSE SURGERY     nasal repair  . SHOULDER ARTHROSCOPY Left 1998  .  TONSILLECTOMY AND ADENOIDECTOMY     as child  . TOTAL HIP ARTHROPLASTY Left 2005    Current Outpatient Medications  Medication Sig Dispense Refill  . Cholecalciferol (VITAMIN D3) 2000 units TABS Take by mouth.    . cyanocobalamin (,VITAMIN B-12,) 1000 MCG/ML injection Inject 1,000 mcg into the muscle every 30 (thirty) days.     . diclofenac sodium (VOLTAREN) 1 % GEL Apply topically 4 (four) times daily.    . fluticasone (FLONASE) 50 MCG/ACT nasal spray Place 1 spray into both nostrils 2 (two) times daily.  11  . loratadine (CLARITIN) 10 MG tablet Take 10 mg by mouth daily.    . NONFORMULARY OR COMPOUNDED ITEM Vitamin E vaginal cream 200u/ml.  One to two ml pv twice weekly.  Disp:  56ml 36 each 3  . prednisoLONE (PRELONE) 15 MG/5ML SOLN Take by mouth as needed.     Marland Kitchen omeprazole (PRILOSEC) 40 MG capsule Take 40 mg by mouth daily.  (Patient not taking: No sig reported)     No current facility-administered medications for this visit.    Family History  Problem Relation Age of Onset  . Lung cancer Mother        deceased  . Lung cancer  Father        deceased  . Cancer - Colon Maternal Grandmother   . Prostate cancer Brother   . Pancreatic cancer Brother     Review of Systems  All other systems reviewed and are negative.   Exam:   BP 135/73 (BP Location: Right Arm, Patient Position: Sitting, Cuff Size: Small)   Pulse (!) 57   Ht 5' 4.5" (1.638 m)   Wt 109 lb 12.8 oz (49.8 kg)   LMP 01/07/1998   BMI 18.56 kg/m   Height: 5' 4.5" (163.8 cm)  General appearance: alert, cooperative and appears stated age Breasts: normal appearance, no masses or tenderness Abdomen: soft, non-tender; bowel sounds normal; no masses,  no organomegaly Lymph nodes: Cervical, supraclavicular, and axillary nodes normal.  No abnormal inguinal nodes palpated Neurologic: Grossly normal  Pelvic: External genitalia:  no lesions              Urethra:  normal appearing urethra with no masses, tenderness or  lesions              Bartholins and Skenes: normal                 Vagina: normal appearing vagina with atrophic changes and no discharge, no lesions, cystocele present              Cervix: no lesions              Pap taken: Yes.   Bimanual Exam:  Uterus:  normal size, contour, position, consistency, mobility, non-tender              Adnexa: normal adnexa and no mass, fullness, tenderness               Rectovaginal: Confirms               Anus:  normal sphincter tone, no lesions  Chaperone, Prince Rome, CMA, was present for exam.  Assessment/Plan: 1. Encntr for gyn exam (general) (routine) w abnormal findings - pap smear obtained today - MMG 11/2019 - release of colonoscopy signed today - BMD done with Dr. Brigitte Pulse - Lab work done with Dr. Brigitte Pulse  2. Lichen planus - clobetasol ointment (TEMOVATE) 0.05 %; Apply 1 application topically 2 (two) times daily. Apply as directed twice daily for 5-7 days with symptoms.  Dispense: 30 g; Refill: 1  3. Female cystocele - has seen urogyn for consultation  4. Postmenopausal -no HRT  5. Pessary maintenance  6. Post-void dribbling

## 2020-05-11 LAB — CYTOLOGY - PAP: Diagnosis: NEGATIVE

## 2020-05-12 DIAGNOSIS — H2511 Age-related nuclear cataract, right eye: Secondary | ICD-10-CM | POA: Diagnosis not present

## 2020-05-23 DIAGNOSIS — E538 Deficiency of other specified B group vitamins: Secondary | ICD-10-CM | POA: Diagnosis not present

## 2020-05-30 DIAGNOSIS — Z23 Encounter for immunization: Secondary | ICD-10-CM | POA: Diagnosis not present

## 2020-06-27 DIAGNOSIS — E538 Deficiency of other specified B group vitamins: Secondary | ICD-10-CM | POA: Diagnosis not present

## 2020-08-01 DIAGNOSIS — E538 Deficiency of other specified B group vitamins: Secondary | ICD-10-CM | POA: Diagnosis not present

## 2020-09-05 DIAGNOSIS — E538 Deficiency of other specified B group vitamins: Secondary | ICD-10-CM | POA: Diagnosis not present

## 2020-09-27 DIAGNOSIS — R1310 Dysphagia, unspecified: Secondary | ICD-10-CM | POA: Diagnosis not present

## 2020-09-27 DIAGNOSIS — R1314 Dysphagia, pharyngoesophageal phase: Secondary | ICD-10-CM | POA: Diagnosis not present

## 2020-09-27 DIAGNOSIS — K225 Diverticulum of esophagus, acquired: Secondary | ICD-10-CM | POA: Diagnosis not present

## 2020-10-10 DIAGNOSIS — E538 Deficiency of other specified B group vitamins: Secondary | ICD-10-CM | POA: Diagnosis not present

## 2020-10-17 DIAGNOSIS — E785 Hyperlipidemia, unspecified: Secondary | ICD-10-CM | POA: Diagnosis not present

## 2020-10-17 DIAGNOSIS — I10 Essential (primary) hypertension: Secondary | ICD-10-CM | POA: Diagnosis not present

## 2020-10-17 DIAGNOSIS — M859 Disorder of bone density and structure, unspecified: Secondary | ICD-10-CM | POA: Diagnosis not present

## 2020-10-24 DIAGNOSIS — E538 Deficiency of other specified B group vitamins: Secondary | ICD-10-CM | POA: Diagnosis not present

## 2020-10-24 DIAGNOSIS — K224 Dyskinesia of esophagus: Secondary | ICD-10-CM | POA: Diagnosis not present

## 2020-10-24 DIAGNOSIS — G629 Polyneuropathy, unspecified: Secondary | ICD-10-CM | POA: Diagnosis not present

## 2020-10-24 DIAGNOSIS — K225 Diverticulum of esophagus, acquired: Secondary | ICD-10-CM | POA: Diagnosis not present

## 2020-10-24 DIAGNOSIS — Z0000 Encounter for general adult medical examination without abnormal findings: Secondary | ICD-10-CM | POA: Diagnosis not present

## 2020-10-24 DIAGNOSIS — M79645 Pain in left finger(s): Secondary | ICD-10-CM | POA: Diagnosis not present

## 2020-10-24 DIAGNOSIS — E785 Hyperlipidemia, unspecified: Secondary | ICD-10-CM | POA: Diagnosis not present

## 2020-10-24 DIAGNOSIS — Z23 Encounter for immunization: Secondary | ICD-10-CM | POA: Diagnosis not present

## 2020-10-24 DIAGNOSIS — R82998 Other abnormal findings in urine: Secondary | ICD-10-CM | POA: Diagnosis not present

## 2020-10-24 DIAGNOSIS — G5603 Carpal tunnel syndrome, bilateral upper limbs: Secondary | ICD-10-CM | POA: Diagnosis not present

## 2020-10-24 DIAGNOSIS — I10 Essential (primary) hypertension: Secondary | ICD-10-CM | POA: Diagnosis not present

## 2020-10-24 DIAGNOSIS — M8580 Other specified disorders of bone density and structure, unspecified site: Secondary | ICD-10-CM | POA: Diagnosis not present

## 2020-10-24 DIAGNOSIS — G4700 Insomnia, unspecified: Secondary | ICD-10-CM | POA: Diagnosis not present

## 2020-11-03 DIAGNOSIS — Z23 Encounter for immunization: Secondary | ICD-10-CM | POA: Diagnosis not present

## 2020-11-14 DIAGNOSIS — E538 Deficiency of other specified B group vitamins: Secondary | ICD-10-CM | POA: Diagnosis not present

## 2020-11-23 DIAGNOSIS — G5603 Carpal tunnel syndrome, bilateral upper limbs: Secondary | ICD-10-CM | POA: Diagnosis not present

## 2020-11-23 DIAGNOSIS — M13842 Other specified arthritis, left hand: Secondary | ICD-10-CM | POA: Diagnosis not present

## 2020-11-23 DIAGNOSIS — H5203 Hypermetropia, bilateral: Secondary | ICD-10-CM | POA: Diagnosis not present

## 2020-11-23 DIAGNOSIS — H26493 Other secondary cataract, bilateral: Secondary | ICD-10-CM | POA: Diagnosis not present

## 2020-11-23 DIAGNOSIS — H524 Presbyopia: Secondary | ICD-10-CM | POA: Diagnosis not present

## 2020-11-23 DIAGNOSIS — H18593 Other hereditary corneal dystrophies, bilateral: Secondary | ICD-10-CM | POA: Diagnosis not present

## 2020-11-23 DIAGNOSIS — H52223 Regular astigmatism, bilateral: Secondary | ICD-10-CM | POA: Diagnosis not present

## 2020-12-12 DIAGNOSIS — M8589 Other specified disorders of bone density and structure, multiple sites: Secondary | ICD-10-CM | POA: Diagnosis not present

## 2020-12-14 DIAGNOSIS — H18599 Other hereditary corneal dystrophies, unspecified eye: Secondary | ICD-10-CM | POA: Diagnosis not present

## 2020-12-14 DIAGNOSIS — S0500XA Injury of conjunctiva and corneal abrasion without foreign body, unspecified eye, initial encounter: Secondary | ICD-10-CM | POA: Diagnosis not present

## 2020-12-14 DIAGNOSIS — H524 Presbyopia: Secondary | ICD-10-CM | POA: Diagnosis not present

## 2020-12-14 DIAGNOSIS — H18509 Unspecified hereditary corneal dystrophies, unspecified eye: Secondary | ICD-10-CM | POA: Diagnosis not present

## 2020-12-14 DIAGNOSIS — H5203 Hypermetropia, bilateral: Secondary | ICD-10-CM | POA: Diagnosis not present

## 2020-12-14 DIAGNOSIS — H52223 Regular astigmatism, bilateral: Secondary | ICD-10-CM | POA: Diagnosis not present

## 2020-12-19 DIAGNOSIS — E538 Deficiency of other specified B group vitamins: Secondary | ICD-10-CM | POA: Diagnosis not present

## 2020-12-28 DIAGNOSIS — H04123 Dry eye syndrome of bilateral lacrimal glands: Secondary | ICD-10-CM | POA: Diagnosis not present

## 2020-12-28 DIAGNOSIS — H524 Presbyopia: Secondary | ICD-10-CM | POA: Diagnosis not present

## 2020-12-28 DIAGNOSIS — H52223 Regular astigmatism, bilateral: Secondary | ICD-10-CM | POA: Diagnosis not present

## 2020-12-28 DIAGNOSIS — H16142 Punctate keratitis, left eye: Secondary | ICD-10-CM | POA: Diagnosis not present

## 2020-12-28 DIAGNOSIS — H5203 Hypermetropia, bilateral: Secondary | ICD-10-CM | POA: Diagnosis not present

## 2021-01-05 DIAGNOSIS — M13842 Other specified arthritis, left hand: Secondary | ICD-10-CM | POA: Diagnosis not present

## 2021-01-15 DIAGNOSIS — H18521 Epithelial (juvenile) corneal dystrophy, right eye: Secondary | ICD-10-CM | POA: Diagnosis not present

## 2021-01-15 DIAGNOSIS — H18522 Epithelial (juvenile) corneal dystrophy, left eye: Secondary | ICD-10-CM | POA: Diagnosis not present

## 2021-01-23 DIAGNOSIS — Z1231 Encounter for screening mammogram for malignant neoplasm of breast: Secondary | ICD-10-CM | POA: Diagnosis not present

## 2021-01-23 DIAGNOSIS — E538 Deficiency of other specified B group vitamins: Secondary | ICD-10-CM | POA: Diagnosis not present

## 2021-02-22 DIAGNOSIS — H5203 Hypermetropia, bilateral: Secondary | ICD-10-CM | POA: Diagnosis not present

## 2021-02-22 DIAGNOSIS — H18593 Other hereditary corneal dystrophies, bilateral: Secondary | ICD-10-CM | POA: Diagnosis not present

## 2021-02-22 DIAGNOSIS — H04123 Dry eye syndrome of bilateral lacrimal glands: Secondary | ICD-10-CM | POA: Diagnosis not present

## 2021-02-22 DIAGNOSIS — H524 Presbyopia: Secondary | ICD-10-CM | POA: Diagnosis not present

## 2021-02-22 DIAGNOSIS — H52223 Regular astigmatism, bilateral: Secondary | ICD-10-CM | POA: Diagnosis not present

## 2021-02-27 DIAGNOSIS — E538 Deficiency of other specified B group vitamins: Secondary | ICD-10-CM | POA: Diagnosis not present

## 2021-04-03 DIAGNOSIS — E538 Deficiency of other specified B group vitamins: Secondary | ICD-10-CM | POA: Diagnosis not present

## 2021-04-13 DIAGNOSIS — M13842 Other specified arthritis, left hand: Secondary | ICD-10-CM | POA: Diagnosis not present

## 2021-05-09 DIAGNOSIS — E538 Deficiency of other specified B group vitamins: Secondary | ICD-10-CM | POA: Diagnosis not present

## 2021-05-14 ENCOUNTER — Ambulatory Visit (HOSPITAL_BASED_OUTPATIENT_CLINIC_OR_DEPARTMENT_OTHER): Payer: Medicare Other | Admitting: Obstetrics & Gynecology

## 2021-06-12 DIAGNOSIS — E538 Deficiency of other specified B group vitamins: Secondary | ICD-10-CM | POA: Diagnosis not present

## 2021-06-20 DIAGNOSIS — H524 Presbyopia: Secondary | ICD-10-CM | POA: Diagnosis not present

## 2021-06-20 DIAGNOSIS — H18593 Other hereditary corneal dystrophies, bilateral: Secondary | ICD-10-CM | POA: Diagnosis not present

## 2021-06-20 DIAGNOSIS — H5203 Hypermetropia, bilateral: Secondary | ICD-10-CM | POA: Diagnosis not present

## 2021-06-20 DIAGNOSIS — H52223 Regular astigmatism, bilateral: Secondary | ICD-10-CM | POA: Diagnosis not present

## 2021-06-26 DIAGNOSIS — M25512 Pain in left shoulder: Secondary | ICD-10-CM | POA: Diagnosis not present

## 2021-06-26 DIAGNOSIS — R5383 Other fatigue: Secondary | ICD-10-CM | POA: Diagnosis not present

## 2021-06-26 DIAGNOSIS — I10 Essential (primary) hypertension: Secondary | ICD-10-CM | POA: Diagnosis not present

## 2021-06-26 DIAGNOSIS — R079 Chest pain, unspecified: Secondary | ICD-10-CM | POA: Diagnosis not present

## 2021-07-05 DIAGNOSIS — M25512 Pain in left shoulder: Secondary | ICD-10-CM | POA: Diagnosis not present

## 2021-07-05 DIAGNOSIS — M19012 Primary osteoarthritis, left shoulder: Secondary | ICD-10-CM | POA: Diagnosis not present

## 2021-07-12 DIAGNOSIS — M25512 Pain in left shoulder: Secondary | ICD-10-CM | POA: Diagnosis not present

## 2021-07-17 DIAGNOSIS — E538 Deficiency of other specified B group vitamins: Secondary | ICD-10-CM | POA: Diagnosis not present

## 2021-07-24 DIAGNOSIS — M25512 Pain in left shoulder: Secondary | ICD-10-CM | POA: Diagnosis not present

## 2021-08-03 DIAGNOSIS — M25512 Pain in left shoulder: Secondary | ICD-10-CM | POA: Diagnosis not present

## 2021-08-09 DIAGNOSIS — M25512 Pain in left shoulder: Secondary | ICD-10-CM | POA: Diagnosis not present

## 2021-08-15 DIAGNOSIS — M19012 Primary osteoarthritis, left shoulder: Secondary | ICD-10-CM | POA: Diagnosis not present

## 2021-08-16 DIAGNOSIS — M25512 Pain in left shoulder: Secondary | ICD-10-CM | POA: Diagnosis not present

## 2021-08-21 DIAGNOSIS — E538 Deficiency of other specified B group vitamins: Secondary | ICD-10-CM | POA: Diagnosis not present

## 2021-08-30 DIAGNOSIS — L298 Other pruritus: Secondary | ICD-10-CM | POA: Diagnosis not present

## 2021-09-18 ENCOUNTER — Telehealth (HOSPITAL_BASED_OUTPATIENT_CLINIC_OR_DEPARTMENT_OTHER): Payer: Self-pay

## 2021-09-18 NOTE — Telephone Encounter (Signed)
Patient called today to let us know that the size of her pessary. It is a size #3. She would like to get a Rx for a new pessary. Please advise. tbw

## 2021-09-20 DIAGNOSIS — H524 Presbyopia: Secondary | ICD-10-CM | POA: Diagnosis not present

## 2021-09-20 DIAGNOSIS — H40013 Open angle with borderline findings, low risk, bilateral: Secondary | ICD-10-CM | POA: Diagnosis not present

## 2021-09-20 DIAGNOSIS — H52223 Regular astigmatism, bilateral: Secondary | ICD-10-CM | POA: Diagnosis not present

## 2021-09-20 DIAGNOSIS — H5203 Hypermetropia, bilateral: Secondary | ICD-10-CM | POA: Diagnosis not present

## 2021-09-20 DIAGNOSIS — H40053 Ocular hypertension, bilateral: Secondary | ICD-10-CM | POA: Diagnosis not present

## 2021-09-21 ENCOUNTER — Other Ambulatory Visit (HOSPITAL_BASED_OUTPATIENT_CLINIC_OR_DEPARTMENT_OTHER): Payer: Self-pay | Admitting: Obstetrics & Gynecology

## 2021-09-25 ENCOUNTER — Other Ambulatory Visit (HOSPITAL_BASED_OUTPATIENT_CLINIC_OR_DEPARTMENT_OTHER): Payer: Self-pay | Admitting: Obstetrics & Gynecology

## 2021-09-25 DIAGNOSIS — R059 Cough, unspecified: Secondary | ICD-10-CM | POA: Diagnosis not present

## 2021-09-25 DIAGNOSIS — R5383 Other fatigue: Secondary | ICD-10-CM | POA: Diagnosis not present

## 2021-09-25 DIAGNOSIS — B349 Viral infection, unspecified: Secondary | ICD-10-CM | POA: Diagnosis not present

## 2021-09-25 DIAGNOSIS — R0981 Nasal congestion: Secondary | ICD-10-CM | POA: Diagnosis not present

## 2021-09-25 DIAGNOSIS — Z1152 Encounter for screening for COVID-19: Secondary | ICD-10-CM | POA: Diagnosis not present

## 2021-09-25 DIAGNOSIS — E538 Deficiency of other specified B group vitamins: Secondary | ICD-10-CM | POA: Diagnosis not present

## 2021-09-25 DIAGNOSIS — J029 Acute pharyngitis, unspecified: Secondary | ICD-10-CM | POA: Diagnosis not present

## 2021-09-25 NOTE — Telephone Encounter (Signed)
Patient did give our office a call back. She will come in to pick up pessary as soon as she can. Patient has been feeling under the weather and will be tested for covid. tbw

## 2021-09-25 NOTE — Telephone Encounter (Signed)
LMOM at 2:16 for patient to call the office. We have a size #3 pessary that she can come by and pick up. tbw

## 2021-10-13 DIAGNOSIS — Z23 Encounter for immunization: Secondary | ICD-10-CM | POA: Diagnosis not present

## 2021-10-30 DIAGNOSIS — E538 Deficiency of other specified B group vitamins: Secondary | ICD-10-CM | POA: Diagnosis not present

## 2021-11-01 DIAGNOSIS — Z23 Encounter for immunization: Secondary | ICD-10-CM | POA: Diagnosis not present

## 2021-11-12 DIAGNOSIS — E538 Deficiency of other specified B group vitamins: Secondary | ICD-10-CM | POA: Diagnosis not present

## 2021-11-12 DIAGNOSIS — M8580 Other specified disorders of bone density and structure, unspecified site: Secondary | ICD-10-CM | POA: Diagnosis not present

## 2021-11-12 DIAGNOSIS — I10 Essential (primary) hypertension: Secondary | ICD-10-CM | POA: Diagnosis not present

## 2021-11-12 DIAGNOSIS — R7989 Other specified abnormal findings of blood chemistry: Secondary | ICD-10-CM | POA: Diagnosis not present

## 2021-11-12 DIAGNOSIS — E785 Hyperlipidemia, unspecified: Secondary | ICD-10-CM | POA: Diagnosis not present

## 2021-11-15 DIAGNOSIS — R82998 Other abnormal findings in urine: Secondary | ICD-10-CM | POA: Diagnosis not present

## 2021-11-15 DIAGNOSIS — I10 Essential (primary) hypertension: Secondary | ICD-10-CM | POA: Diagnosis not present

## 2021-11-19 DIAGNOSIS — E538 Deficiency of other specified B group vitamins: Secondary | ICD-10-CM | POA: Diagnosis not present

## 2021-11-19 DIAGNOSIS — M8580 Other specified disorders of bone density and structure, unspecified site: Secondary | ICD-10-CM | POA: Diagnosis not present

## 2021-11-19 DIAGNOSIS — E785 Hyperlipidemia, unspecified: Secondary | ICD-10-CM | POA: Diagnosis not present

## 2021-11-19 DIAGNOSIS — I10 Essential (primary) hypertension: Secondary | ICD-10-CM | POA: Diagnosis not present

## 2021-11-19 DIAGNOSIS — Z0000 Encounter for general adult medical examination without abnormal findings: Secondary | ICD-10-CM | POA: Diagnosis not present

## 2021-11-19 DIAGNOSIS — K224 Dyskinesia of esophagus: Secondary | ICD-10-CM | POA: Diagnosis not present

## 2021-11-19 DIAGNOSIS — M79645 Pain in left finger(s): Secondary | ICD-10-CM | POA: Diagnosis not present

## 2021-11-19 DIAGNOSIS — G5603 Carpal tunnel syndrome, bilateral upper limbs: Secondary | ICD-10-CM | POA: Diagnosis not present

## 2021-11-19 DIAGNOSIS — Z23 Encounter for immunization: Secondary | ICD-10-CM | POA: Diagnosis not present

## 2021-11-19 DIAGNOSIS — R7989 Other specified abnormal findings of blood chemistry: Secondary | ICD-10-CM | POA: Diagnosis not present

## 2021-11-19 DIAGNOSIS — R5383 Other fatigue: Secondary | ICD-10-CM | POA: Diagnosis not present

## 2021-11-19 DIAGNOSIS — Z1331 Encounter for screening for depression: Secondary | ICD-10-CM | POA: Diagnosis not present

## 2021-11-19 DIAGNOSIS — Z1339 Encounter for screening examination for other mental health and behavioral disorders: Secondary | ICD-10-CM | POA: Diagnosis not present

## 2021-11-19 DIAGNOSIS — M25512 Pain in left shoulder: Secondary | ICD-10-CM | POA: Diagnosis not present

## 2021-11-19 DIAGNOSIS — G629 Polyneuropathy, unspecified: Secondary | ICD-10-CM | POA: Diagnosis not present

## 2021-11-19 DIAGNOSIS — R1310 Dysphagia, unspecified: Secondary | ICD-10-CM | POA: Diagnosis not present

## 2021-11-19 DIAGNOSIS — K225 Diverticulum of esophagus, acquired: Secondary | ICD-10-CM | POA: Diagnosis not present

## 2021-11-19 DIAGNOSIS — R002 Palpitations: Secondary | ICD-10-CM | POA: Diagnosis not present

## 2021-11-19 DIAGNOSIS — Z86010 Personal history of colonic polyps: Secondary | ICD-10-CM | POA: Diagnosis not present

## 2021-11-19 DIAGNOSIS — M25562 Pain in left knee: Secondary | ICD-10-CM | POA: Diagnosis not present

## 2021-12-04 ENCOUNTER — Encounter (HOSPITAL_BASED_OUTPATIENT_CLINIC_OR_DEPARTMENT_OTHER): Payer: Self-pay | Admitting: Obstetrics & Gynecology

## 2021-12-04 DIAGNOSIS — E538 Deficiency of other specified B group vitamins: Secondary | ICD-10-CM | POA: Diagnosis not present

## 2021-12-07 ENCOUNTER — Other Ambulatory Visit (HOSPITAL_BASED_OUTPATIENT_CLINIC_OR_DEPARTMENT_OTHER): Payer: Self-pay | Admitting: Obstetrics & Gynecology

## 2021-12-07 DIAGNOSIS — N8110 Cystocele, unspecified: Secondary | ICD-10-CM

## 2021-12-07 DIAGNOSIS — N393 Stress incontinence (female) (male): Secondary | ICD-10-CM

## 2021-12-07 DIAGNOSIS — N812 Incomplete uterovaginal prolapse: Secondary | ICD-10-CM

## 2022-04-29 HISTORY — PX: OTHER SURGICAL HISTORY: SHX169

## 2022-05-07 ENCOUNTER — Ambulatory Visit (HOSPITAL_BASED_OUTPATIENT_CLINIC_OR_DEPARTMENT_OTHER): Payer: Medicare Other | Admitting: Obstetrics & Gynecology

## 2022-05-15 ENCOUNTER — Ambulatory Visit (HOSPITAL_BASED_OUTPATIENT_CLINIC_OR_DEPARTMENT_OTHER): Payer: Medicare Other | Admitting: Obstetrics & Gynecology

## 2022-05-23 ENCOUNTER — Encounter (HOSPITAL_BASED_OUTPATIENT_CLINIC_OR_DEPARTMENT_OTHER): Payer: Self-pay | Admitting: *Deleted

## 2022-05-25 IMAGING — MR MR LUMBAR SPINE W/O CM
4 of 5 series · 25 of 48 positions shown · non-contrast
Comparison: CT Abdomen and Pelvis 09/21/2010

CLINICAL DATA: 74-year-old female with persistent low back pain.
Recurrent left groin pain, left buttock and hip pain. No known
injury. Prior left hip arthroplasty but unrevealing left hip MRI and
three-phase bone scan this month.

EXAM:
MRI LUMBAR SPINE WITHOUT CONTRAST
TECHNIQUE: Multiplanar, multisequence MR imaging of the lumbar spine was
performed. No intravenous contrast was administered.

[Series 3: T1 · sagittal · 4.0mm · 0.55mm/px · 5 of 13 slices shown (1 of 2)]
[im 1/13]
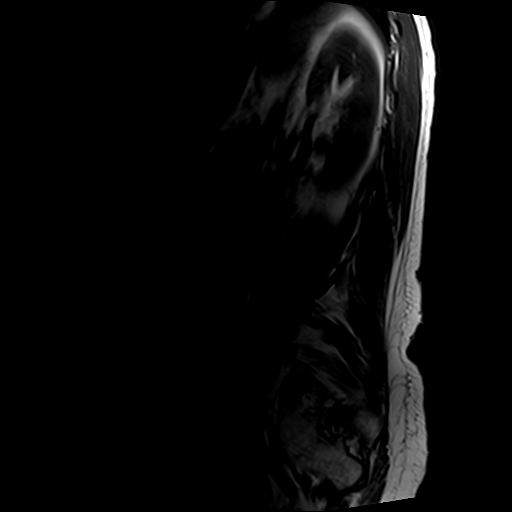
[im 4/13]
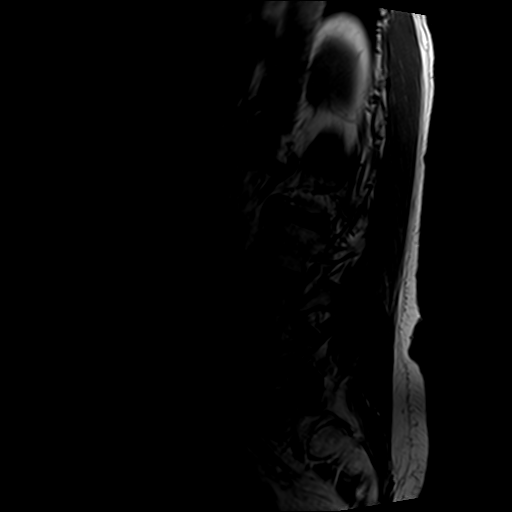
[im 7/13]
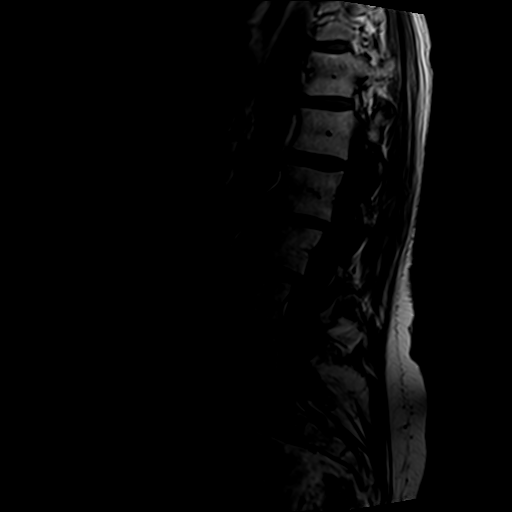
[im 10/13]
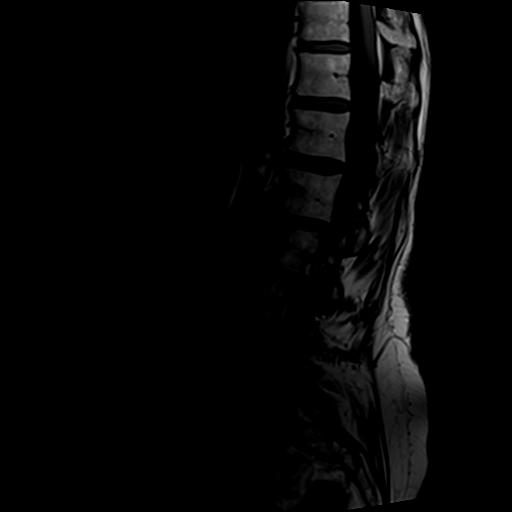
[im 13/13]
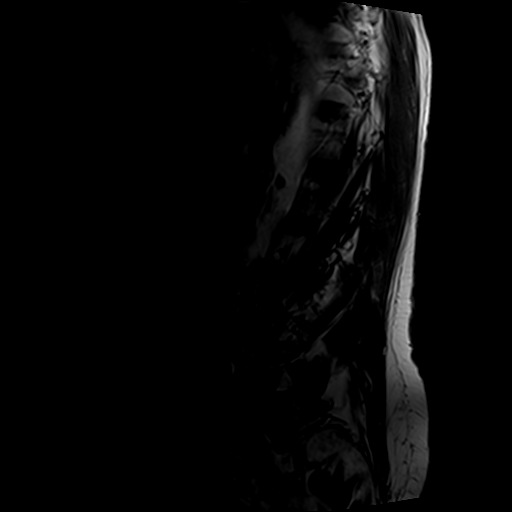

[Series 4: T2 post-contrast · sagittal · 4.0mm · 0.55mm/px · 5 of 13 slices shown]
[im 1/13]
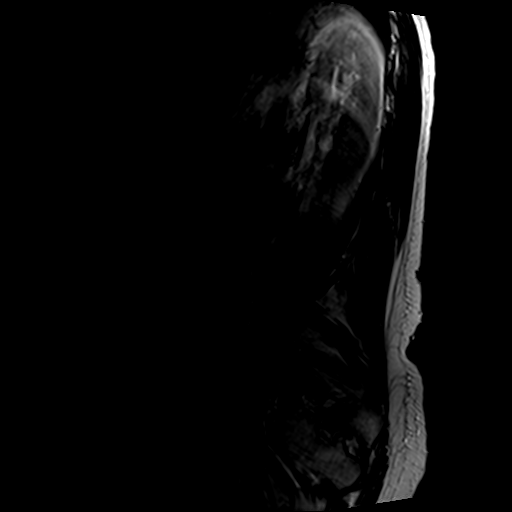
[im 4/13]
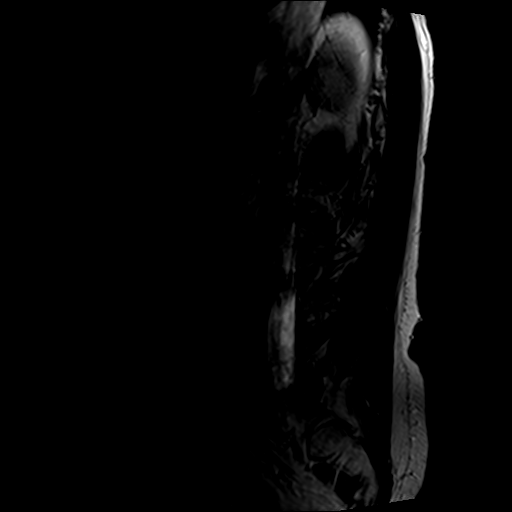
[im 7/13]
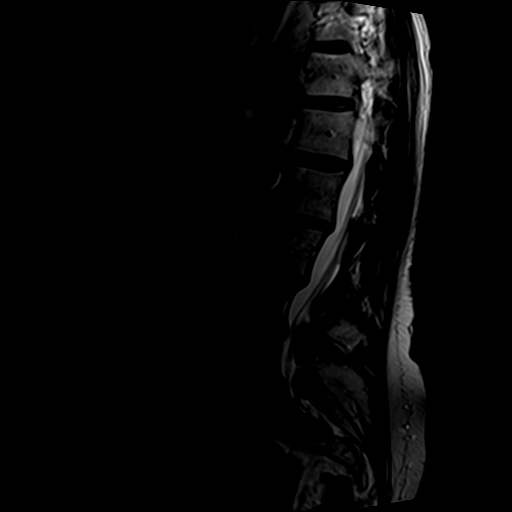
[im 10/13]
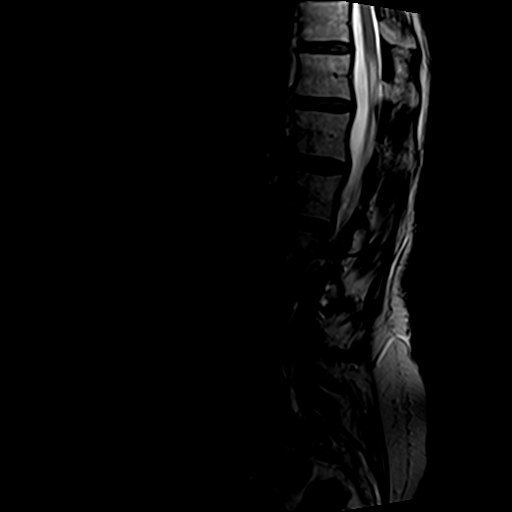
[im 13/13]
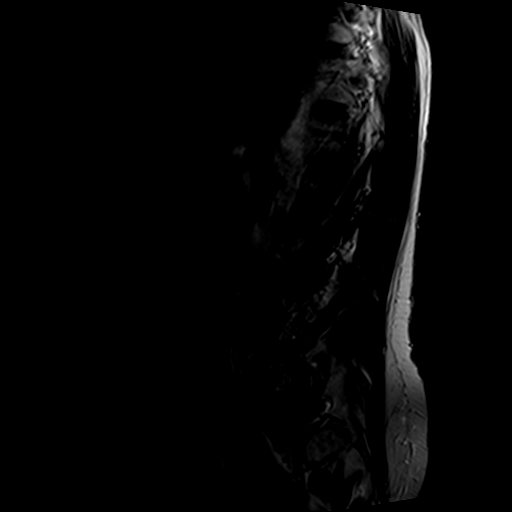

[Series 5: T2 · axial · 4.0mm · 0.70mm/px · z∈[-108,+77]mm · 10 of 40 slices shown]
[im 3/40]
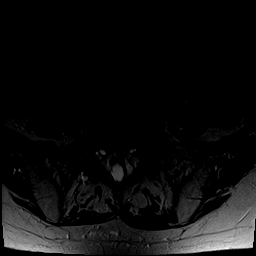
[im 6/40]
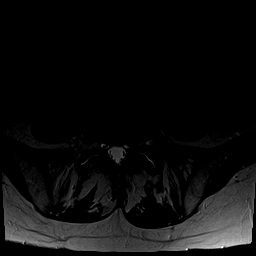
[im 8/40]
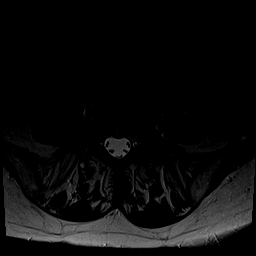
[im 14/40]
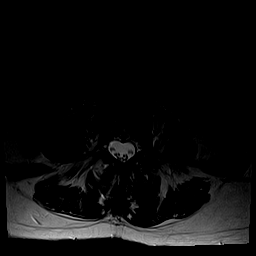
[im 19/40]
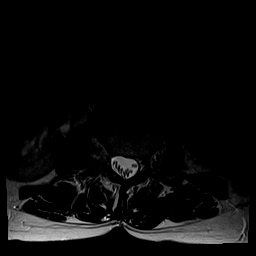
[im 21/40]
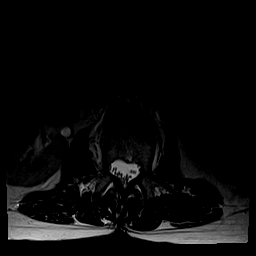
[im 24/40]
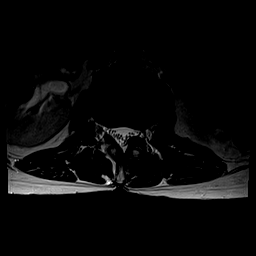
[im 29/40]
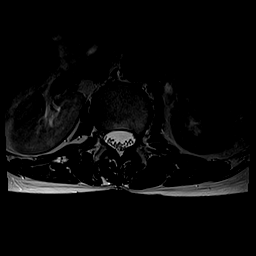
[im 34/40]
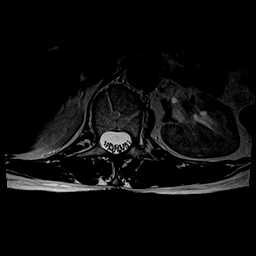
[im 40/40]
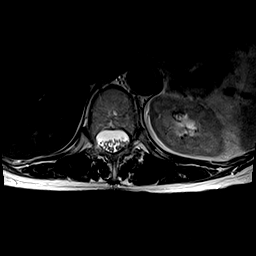

[Series 6: T1 · axial · 4.0mm · 0.35mm/px · z∈[-108,+46]mm · 5 of 40 slices shown (2 of 2)]
[im 3/40]
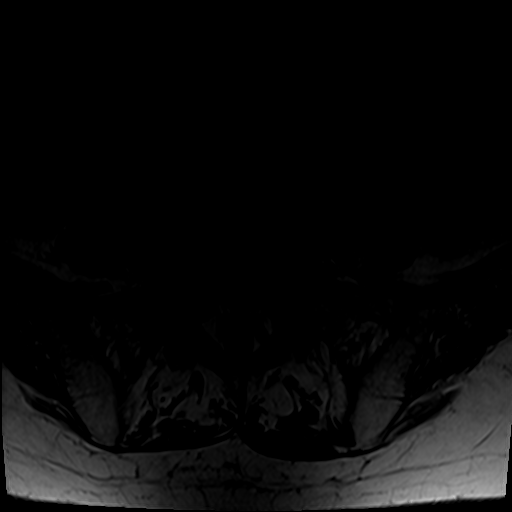
[im 6/40]
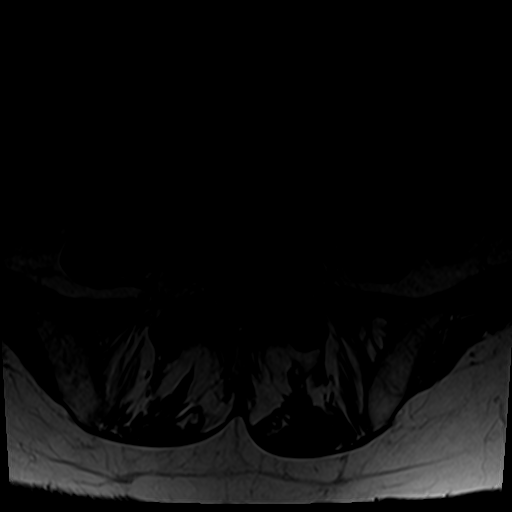
[im 8/40]
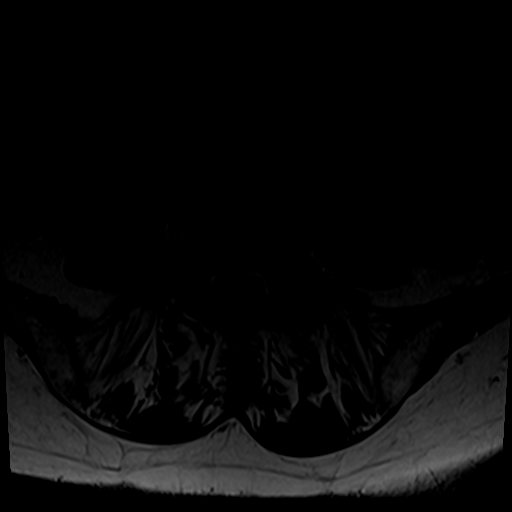
[im 21/40]
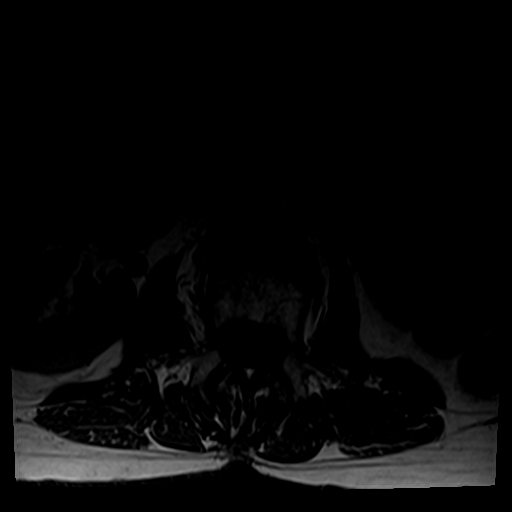
[im 34/40]
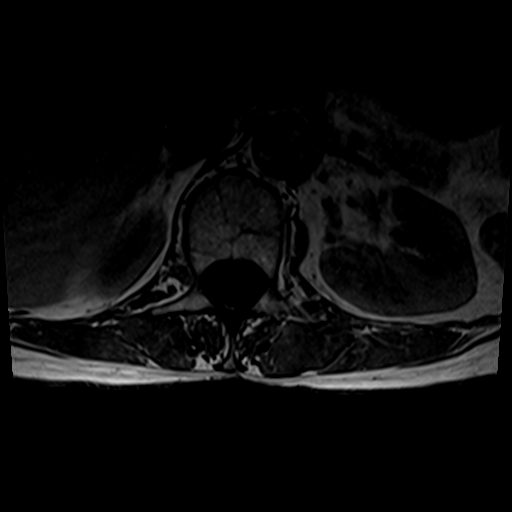

[25 of 48 positions shown; findings below may reference images not displayed]

FINDINGS: Segmentation:  Normal on the comparison.

Alignment: Chronic mildly exaggerated lumbar lordosis. Mild
retrolisthesis of L2 on L3 and L3 on L4. Subtle anterolisthesis of
L5 on S1.

Vertebrae: Mild chronic degenerative endplate marrow signal changes
at L2 and L3, with faint superimposed degenerative endplate edema.
Normal background bone marrow signal. No other acute osseous
abnormality identified. intact visible sacrum and SI joints.

Conus medullaris and cauda equina: Conus extends to the T11-T12
level. No lower spinal cord or conus signal abnormality. Capacious
spinal canal at most levels. Cauda equina nerve roots appear normal.

Paraspinal and other soft tissues: Stable, negative.

Disc levels:

Visible lower thoracic levels through L1-L2 are negative.

L2-L3: Mild retrolisthesis. Disc desiccation, disc space loss and
mild circumferential disc bulge. Mild posterior element hypertrophy.
No stenosis.

L3-L4: Mild retrolisthesis. Disc desiccation and disc space loss.
Mild circumferential disc bulge. Mild to moderate posterior element
hypertrophy. No significant spinal stenosis. No convincing lateral
recess stenosis. Mild bilateral L3 foraminal stenosis.

L4-L5: Disc desiccation and circumferential disc bulge with
broad-based left subarticular and foraminal component of disc
(series 5, image 31). Moderate posterior element hypertrophy. No
spinal stenosis. Mild if any left lateral recess stenosis (left L5
nerve level). Only mild left L4 foraminal stenosis.

L5-S1: Mild disc desiccation and circumferential disc bulge.
Moderate facet degeneration with degenerative facet joint fluid.
Mild to moderate ligament flavum hypertrophy. No spinal stenosis.
Borderline to mild left lateral recess stenosis.

Additionally, there is a right side 6 mm degenerative synovial cyst
directed anteriorly into the left L5 neural foramen from the facet
(series 5, image 34). Subsequent moderate left L5 foraminal
stenosis.
IMPRESSION: 1. Lumbar spine degeneration with no significant spinal stenosis.

2. Symptomatic level with regard to pain radiating to the left might
be L5-S1, where facet arthropathy is associated with a 6 mm synovial
cyst projecting into the left neural foramen. Query Left L5
radiculitis.

3. Otherwise mild left lateral recess stenosis at L4-L5 and L5-S1,
and mild left L4 foraminal stenosis related to broad-based disc.

## 2022-06-11 ENCOUNTER — Encounter (HOSPITAL_BASED_OUTPATIENT_CLINIC_OR_DEPARTMENT_OTHER): Payer: Self-pay | Admitting: Obstetrics & Gynecology

## 2022-06-14 ENCOUNTER — Other Ambulatory Visit (HOSPITAL_BASED_OUTPATIENT_CLINIC_OR_DEPARTMENT_OTHER): Payer: Self-pay | Admitting: Obstetrics & Gynecology

## 2022-06-14 DIAGNOSIS — N393 Stress incontinence (female) (male): Secondary | ICD-10-CM

## 2022-06-20 NOTE — Therapy (Signed)
OUTPATIENT PHYSICAL THERAPY FEMALE PELVIC EVALUATION   Patient Name: [name] MRN: [id number] DOB:[birth year], 77 y.o., female Today's Date: 06/21/2022  END OF SESSION:  PT End of Session - 06/21/22 0848     Visit Number 1    Date for PT Re-Evaluation 09/13/22    Authorization Type [employer] medicare    PT Start Time 0845    PT Stop Time 0925    PT Time Calculation (min) 40 min    Activity Tolerance Patient tolerated treatment well    Behavior During Therapy [facility] for tasks assessed/performed             Past Medical History:  Diagnosis Date   Abnormal Pap smear 1995   ascus   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 being tested   Inflammatory polyps of colon with rectal bleeding (HCC) 12/96   Lichen planus    genital,gums,legs,back   Mitral valve prolapse    Osteopenia    Shingles 6/94   Shoulder fracture, left    Vitamin B12 deficiency 09/2016   Past Surgical History:  Procedure Laterality Date   COLONOSCOPY  04/08/2014   Polyps - repeat 5 years - Dr. Randa Evens    CYSTOURETHROSCOPY  Sacrospinous ligament suspension  Anterior repair  Cystoscopy   04/29/2022   NOSE SURGERY     nasal repair   SHOULDER ARTHROSCOPY Left 01/08/1996   TONSILLECTOMY AND ADENOIDECTOMY     as child   TOTAL HIP ARTHROPLASTY Left 01/08/2003   Patient Active Problem List   Diagnosis Date Noted   Pain in left hip 07/14/2019   Lichen planus 11/01/2016   Dyspnea 08/06/2012   Fatigue 08/06/2012   MVP (mitral valve prolapse) 08/06/2012   Female cystocele 04/01/2012   Rectocele 04/01/2012   Uterine prolapse 04/01/2012   Genuine stress incontinence, female 04/01/2012    PCP: Cleatis Polka., MD  REFERRING PROVIDER: Jerene Bears, MD     REFERRING DIAG: N39.3 (ICD-10-CM) - Genuine stress incontinence, female   THERAPY DIAG:  Muscle weakness (generalized)  Other lack of coordination  Pain in left  hip  Rationale for Evaluation and Treatment: Rehabilitation  ONSET DATE: 04/29/22  SUBJECTIVE:                                                                                                                                                                                           SUBJECTIVE STATEMENT: Patient had surgery on 04/29/22. She continues to have urinary leakage. She has been doing kegels and reduction  of leakage. She is using the estrogen cream.  Fluid intake: Yes: 1 cup of coffee in morning, then water     PAIN:  Are you having pain? Yes: NPRS scale: 4-8/10 Pain location: left hip Pain description: achy, sharp, intermittent Aggravating factors: cough, sneeze, increased activity Relieving factors: nothing    PRECAUTIONS: left posterior hip replacement  WEIGHT BEARING RESTRICTIONS: No  FALLS:  Has patient fallen in last 6 months? No  LIVING ENVIRONMENT: Lives with: lives alone  OCCUPATION: retired, no exercise  PLOF: Independent  PATIENT GOALS: reduce urinary leakage and anal leakage, how to lift properly  PERTINENT HISTORY:  Lichen planus; osteopenia; divertivulitis; Left Total hip arthroplasty;  CYSTOURETHROSCOPY sacrospinous ligament suspension anterior repair Sacrospinous ligament suspension  Anterior repair  Cystoscopy 04/29/22   BOWEL MOVEMENT: Pain with bowel movement: No Type of bowel movement:Type (Bristol Stool Scale) Type 4, Frequency daily, Strain No, and Splinting no ; every 3-4 weeks has severe diarrhea and comes on suddenly and possible cramps in the middle of the day Fully empty rectum: No, later in day will have gas with some stool coming out Leakage: Yes:   Pads: Yes: panty liner Fiber supplement: No, eats a lot of fiber  URINATION: Pain with urination: No Fully empty bladder: No wait after urination and then post void dribble Stream: Strong Urgency: Yes: less since surgery Frequency: every 2-3 hours Leakage: Coughing, Lifting, and  possible gardening Pads: Yes: pantyliner  INTERCOURSE: not sexually active   PREGNANCY: Vaginal deliveries 1 Tearing No   PROLAPSE: Corrected with surgery   OBJECTIVE:   DIAGNOSTIC FINDINGS:  none   COGNITION: Overall cognitive status: Within functional limits for tasks assessed     SENSATION: Light touch: Appears intact Proprioception: Appears intact   POSTURE: No Significant postural limitations  PELVIC ALIGNMENT: correct alignment  LUMBARAROM/PROM: full lumbar ROM    LOWER EXTREMITY MMT:  MMT Right eval Left eval  Hip flexion 5/5 4/5 with pain  Hip extension 4/5 with pain in left hip 5/5  Hip abduction 4/5 3+/5 with pain  Hip adduction 5/5 5/5   PALPATION:   General  tender in left inguinal area, along the left iliopsoas, and left upper quadrant                External Perineal Exam intact                             Internal Pelvic Floor tenderness located on the levator ani and obturator internist, tightness of the anococcygeal ligament  Patient confirms identification and approves PT to assess internal pelvic floor and treatment Yes  PELVIC MMT:   MMT eval  Vaginal 2/5 except for left side that is 1/5  Internal Anal Sphincter 2/5  External Anal Sphincter 2/5  Puborectalis 1/5  (Blank rows = not tested)        TONE: average  PROLAPSE: none  TODAY'S TREATMENT:  DATE: 06/21/22  EVAL See below   PATIENT EDUCATION:  Education details: instructed patient to continue her Kegels, dicussed her goals and what therapy will entail Person educated: Patient Education method: Explanation Education comprehension: verbalized understanding  HOME EXERCISE PROGRAM: See above.   ASSESSMENT:  CLINICAL IMPRESSION: Patient is a 77 y.o. female who was seen today for physical therapy evaluation and treatment for stress  incontinence.Patient started to have urinary leakage with coughing, laughing, and increased activity. She has a repair of a cystocele on 04/29/22. Patient leaks stool when she passes gas. Pelvic floor strength vaginally and rectally is 2/5 with puborectalis strength is 1/5. She has tightness at the anococcygeal ligament and along the perineal body. She has tenderness located on the obturator internist and levator ani. Patient has left hip pain at level 4-8/10 with increased activity, coughing and sneezing. She has tenderness located in the left inguinal area and iliopsoas. Patient would benefit from skilled therapy to improve pelvic floor coordination and strength and reduce left hip pain.    OBJECTIVE IMPAIRMENTS: decreased activity tolerance, decreased coordination, decreased strength, increased fascial restrictions, increased muscle spasms, and pain.   ACTIVITY LIMITATIONS: carrying, lifting, squatting, continence, and locomotion level  PARTICIPATION LIMITATIONS: cleaning, laundry, community activity, and yard work  PERSONAL FACTORS: Age, Time since onset of injury/illness/exacerbation, and 3+ comorbidities:    Lichen planus; osteopenia; divertivulitis; Left Total hip arthroplasty;  CYSTOURETHROSCOPY sacrospinous ligament suspension anterior repair 04/29/22 are also affecting patient's functional outcome.   REHAB POTENTIAL: Excellent  CLINICAL DECISION MAKING: Stable/uncomplicated  EVALUATION COMPLEXITY: Low   GOALS: Goals reviewed with patient? Yes  SHORT TERM GOALS: Target date: 07/18/22  Patient independent with initial HEP for pelvic floor strength.  Baseline: Goal status: INITIAL  2.  Patient able to perform circular contraction of the vaginal canal and rectum due to reduction of restrictions of the muscles and fascia.  Baseline:  Goal status: INITIAL  3.  Patient reports her left hip pain decreased >/= 25% due to decreased trigger points in the pelvic floor muscles.  Baseline:   Goal status: INITIAL   LONG TERM GOALS: Target date: 09/13/22  Patient independent with advanced HEP for core, hip and pelvic floor to reduce leakage.  Baseline:  Goal status: INITIAL  2.  Vaginal strength increased >/= 3/5  so her urinary leakage with coughing and sneezing decreased >/= 75%.  Baseline:  Goal status: INITIAL  3.  Rectal strength >/= 3/5 holding for 30 seconds to reduce fecal leakage with gas >/= 75%.  Baseline:  Goal status: INITIAL  4.  Patient understands how to lift correctly to reduce strain on the pelvic floor.  Baseline:  Goal status: INITIAL   PLAN:  PT FREQUENCY: 1x/week  PT DURATION: 12 weeks  PLANNED INTERVENTIONS: Therapeutic exercises, Therapeutic activity, Neuromuscular re-education, Patient/Family education, Joint mobilization, Dry Needling, Electrical stimulation, Cryotherapy, Moist heat, Taping, Ultrasound, Biofeedback, and Manual therapy  PLAN FOR NEXT SESSION: manual work on the vaginal area to improve circular contraction and same with rectum, pelvic floor exercises, lifting   Eulis Foster, PT 06/21/22 10:38 AM

## 2022-06-21 ENCOUNTER — Ambulatory Visit: Payer: Medicare Other | Attending: Obstetrics & Gynecology | Admitting: Physical Therapy

## 2022-06-21 ENCOUNTER — Other Ambulatory Visit: Payer: Self-pay

## 2022-06-21 ENCOUNTER — Encounter: Payer: Self-pay | Admitting: Physical Therapy

## 2022-06-21 DIAGNOSIS — M6281 Muscle weakness (generalized): Secondary | ICD-10-CM | POA: Insufficient documentation

## 2022-06-21 DIAGNOSIS — M25552 Pain in left hip: Secondary | ICD-10-CM | POA: Insufficient documentation

## 2022-06-21 DIAGNOSIS — N393 Stress incontinence (female) (male): Secondary | ICD-10-CM | POA: Insufficient documentation

## 2022-06-21 DIAGNOSIS — R278 Other lack of coordination: Secondary | ICD-10-CM | POA: Insufficient documentation

## 2022-06-24 ENCOUNTER — Encounter: Payer: Self-pay | Admitting: Physical Therapy

## 2022-06-24 ENCOUNTER — Ambulatory Visit: Payer: Medicare Other | Admitting: Physical Therapy

## 2022-06-24 DIAGNOSIS — R278 Other lack of coordination: Secondary | ICD-10-CM

## 2022-06-24 DIAGNOSIS — M25552 Pain in left hip: Secondary | ICD-10-CM

## 2022-06-24 DIAGNOSIS — M6281 Muscle weakness (generalized): Secondary | ICD-10-CM

## 2022-06-24 NOTE — Therapy (Signed)
OUTPATIENT PHYSICAL THERAPY FEMALE PELVIC TREATMENT   Patient Name: [name] MRN: [id number] DOB:1945/09/17, 77 y.o., female Today's Date: 06/24/2022  END OF SESSION:  PT End of Session - 06/24/22 0927     Visit Number 2    Date for PT Re-Evaluation 09/13/22    Authorization Type [employer] medicare    Authorization - Visit Number 2    Authorization - Number of Visits 10    PT Start Time 0930    PT Stop Time 1010    PT Time Calculation (min) 40 min    Activity Tolerance Patient tolerated treatment well    Behavior During Therapy [facility] for tasks assessed/performed             Past Medical History:  Diagnosis Date   Abnormal Pap smear 1995   [place]   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 being tested   Inflammatory polyps of colon with rectal bleeding (HCC) 12/96   Lichen planus    genital,gums,legs,back   Mitral valve prolapse    Osteopenia    Shingles 6/94   Shoulder fracture, left    Vitamin B12 deficiency 09/2016   Past Surgical History:  Procedure Laterality Date   COLONOSCOPY  04/08/2014   Polyps - repeat 5 years - Dr. Randa Evens    CYSTOURETHROSCOPY  Sacrospinous ligament suspension  Anterior repair  Cystoscopy   04/29/2022   NOSE SURGERY     nasal repair   SHOULDER ARTHROSCOPY Left 01/08/1996   TONSILLECTOMY AND ADENOIDECTOMY     as child   TOTAL HIP ARTHROPLASTY Left 01/08/2003   Patient Active Problem List   Diagnosis Date Noted   Pain in left hip 07/14/2019   Lichen planus 11/01/2016   Dyspnea 08/06/2012   Fatigue 08/06/2012   MVP (mitral valve prolapse) 08/06/2012   Female cystocele 04/01/2012   Rectocele 04/01/2012   Uterine prolapse 04/01/2012   Genuine stress incontinence, female 04/01/2012    PCP: Cleatis Polka., MD  REFERRING PROVIDER: Jerene Bears, MD     REFERRING DIAG: N39.3 (ICD-10-CM) - Genuine stress incontinence, female   THERAPY DIAG:  Muscle  weakness (generalized)  Other lack of coordination  Pain in left hip  Rationale for Evaluation and Treatment: Rehabilitation  ONSET DATE: 04/29/22  SUBJECTIVE:                                                                                                                                                                                           SUBJECTIVE STATEMENT: I  feel good since last visit.   Fluid intake: Yes: 1 cup of coffee in morning, then water     PAIN:  Are you having pain? Yes: NPRS scale: 4-8/10 Pain location: left hip Pain description: achy, sharp, intermittent Aggravating factors: cough, sneeze, increased activity Relieving factors: nothing    PRECAUTIONS: left posterior hip replacement  WEIGHT BEARING RESTRICTIONS: No  FALLS:  Has patient fallen in last 6 months? No  LIVING ENVIRONMENT: Lives with: lives alone  OCCUPATION: retired, no exercise  PLOF: Independent  PATIENT GOALS: reduce urinary leakage and anal leakage, how to lift properly  PERTINENT HISTORY:  Lichen planus; osteopenia; divertivulitis; Left Total hip arthroplasty;  CYSTOURETHROSCOPY sacrospinous ligament suspension anterior repair Sacrospinous ligament suspension  Anterior repair  Cystoscopy 04/29/22   BOWEL MOVEMENT: Pain with bowel movement: No Type of bowel movement:Type (Bristol Stool Scale) Type 4, Frequency daily, Strain No, and Splinting no ; every 3-4 weeks has severe diarrhea and comes on suddenly and possible cramps in the middle of the day Fully empty rectum: No, later in day will have gas with some stool coming out Leakage: Yes:   Pads: Yes: panty liner Fiber supplement: No, eats a lot of fiber  URINATION: Pain with urination: No Fully empty bladder: No wait after urination and then post void dribble Stream: Strong Urgency: Yes: less since surgery Frequency: every 2-3 hours Leakage: Coughing, Lifting, and possible gardening Pads: Yes: pantyliner  INTERCOURSE:  not sexually active   PREGNANCY: Vaginal deliveries 1 Tearing No   PROLAPSE: Corrected with surgery   OBJECTIVE:   DIAGNOSTIC FINDINGS:  none   COGNITION: Overall cognitive status: Within functional limits for tasks assessed     SENSATION: Light touch: Appears intact Proprioception: Appears intact   POSTURE: No Significant postural limitations  PELVIC ALIGNMENT: correct alignment  LUMBARAROM/PROM: full lumbar ROM    LOWER EXTREMITY MMT:  MMT Right eval Left eval  Hip flexion 5/5 4/5 with pain  Hip extension 4/5 with pain in left hip 5/5  Hip abduction 4/5 3+/5 with pain  Hip adduction 5/5 5/5   PALPATION:   General  tender in left inguinal area, along the left iliopsoas, and left upper quadrant                External Perineal Exam intact                             Internal Pelvic Floor tenderness located on the levator ani and obturator internist, tightness of the anococcygeal ligament  Patient confirms identification and approves PT to assess internal pelvic floor and treatment Yes  PELVIC MMT:   MMT eval 06/24/22  Vaginal 2/5 except for left side that is 1/5 2/5 circular contraction   Internal Anal Sphincter 2/5   External Anal Sphincter 2/5   Puborectalis 1/5   (Blank rows = not tested)        TONE: average  PROLAPSE: none  TODAY'S TREATMENT:    06/24/22 Manual: Myofascial release: Urogenital release going through the layers of fascial Internal pelvic floor techniques: No emotional/communication barriers or cognitive limitation. Patient is motivated to learn. Patient understands and agrees with treatment goals and plan. PT explains patient will be examined in standing, sitting, and lying down to see how their muscles and joints work. When they are ready, they will be asked to remove their underwear so PT can examine their perineum. The patient is also given the option of providing their  own chaperone as one is not provided in our facility.  The patient also has the right and is explained the right to defer or refuse any part of the evaluation or treatment including the internal exam. With the patient's consent, PT will use one gloved finger to gently assess the muscles of the pelvic floor, seeing how well it contracts and relaxes and if there is muscle symmetry. After, the patient will get dressed and PT and patient will discuss exam findings and plan of care. PT and patient discuss plan of care, schedule, attendance policy and HEP activities.  Going through the vaginal canal working on the introitus, levator ani and obturator internist Neuromuscular re-education: Pelvic floor contraction training: Therapist finger in the vaginal canal working an a circular contraction with tactile cues to the lateral wall of the introitus Exercises: Strengthening: Dead lift without weights and with 5# Supine bride with ball squeeze and pelvic floor contraction 15 x Supine march with ball between knees with ball squeeze and pelvic floor contraction 10 x each side Therapeutic activities: Functional strengthening activities: Educated patient on correct lifting with keeping the distance between lower rib cage and pubic bone, breathing out, flexing at hips and knees  PATIENT EDUCATION: 06/24/22 Education details: Access Code: [email or web address], lifting correctly Person educated: Patient Education method: Explanation, Demonstration, Tactile cues, Verbal cues, and Handouts Education comprehension: verbalized understanding, returned demonstration, verbal cues required, tactile cues required, and needs further education   HOME EXERCISE PROGRAM: 06/24/22 Access Code: [email or web address] URL: [email or web address] Date: 06/24/2022 Prepared by: Eulis Foster  Exercises - Supine Bridge with [employer] Between Knees  - 1 x daily - 7 x weekly - 1 sets - 10 reps - Supine March  - 1 x daily - 7 x weekly - 1 sets - 10 reps    ASSESSMENT:  CLINICAL  IMPRESSION: Patient is a 77 y.o. female who was seen today for physical therapy  treatment for stress incontinence.Patient started to have urinary leakage with coughing, laughing, and increased activity. She has a repair of a cystocele on 04/29/22. Pelvic floor strength is now 2/5 with circular contraction. She has difficulty with with hip flexion due to thightness in the posterior left hip. Patient would benefit from skilled therapy to improve pelvic floor coordination and strength and reduce left hip pain.    OBJECTIVE IMPAIRMENTS: decreased activity tolerance, decreased coordination, decreased strength, increased fascial restrictions, increased muscle spasms, and pain.   ACTIVITY LIMITATIONS: carrying, lifting, squatting, continence, and locomotion level  PARTICIPATION LIMITATIONS: cleaning, laundry, community activity, and yard work  PERSONAL FACTORS: Age, Time since onset of injury/illness/exacerbation, and 3+ comorbidities:    Lichen planus; osteopenia; divertivulitis; Left Total hip arthroplasty;  CYSTOURETHROSCOPY sacrospinous ligament suspension anterior repair 04/29/22 are also affecting patient's functional outcome.   REHAB POTENTIAL: Excellent  CLINICAL DECISION MAKING: Stable/uncomplicated  EVALUATION COMPLEXITY: Low   GOALS: Goals reviewed with patient? Yes  SHORT TERM GOALS: Target date: 07/18/22  Patient independent with initial HEP for pelvic floor strength.  Baseline: Goal status: INITIAL  2.  Patient able to perform circular contraction of the vaginal canal and rectum due to reduction of restrictions of the muscles and fascia.  Baseline:  Goal status: INITIAL  3.  Patient reports her left hip pain decreased >/= 25% due to decreased trigger points in the pelvic floor muscles.  Baseline:  Goal status: INITIAL   LONG TERM GOALS: Target date: 09/13/22  Patient independent with advanced HEP for core, hip and pelvic floor to reduce leakage.  Baseline:  Goal status:  INITIAL  2.  Vaginal strength increased >/= 3/5  so her urinary leakage with coughing and sneezing decreased >/= 75%.  Baseline:  Goal status: INITIAL  3.  Rectal strength >/= 3/5 holding for 30 seconds to reduce fecal leakage with gas >/= 75%.  Baseline:  Goal status: INITIAL  4.  Patient understands how to lift correctly to reduce strain on the pelvic floor.  Baseline:  Goal status: INITIAL   PLAN:  PT FREQUENCY: 1x/week  PT DURATION: 12 weeks  PLANNED INTERVENTIONS: Therapeutic exercises, Therapeutic activity, Neuromuscular re-education, Patient/Family education, Joint mobilization, Dry Needling, Electrical stimulation, Cryotherapy, Moist heat, Taping, Ultrasound, Biofeedback, and Manual therapy  PLAN FOR NEXT SESSION: manual work on the rectal  area to improve circular contraction , pelvic floor exercises, lifting, dry needle of the left hip   Eulis Foster, PT 06/24/22 10:15 AM

## 2022-07-03 ENCOUNTER — Encounter: Payer: Self-pay | Admitting: Physical Therapy

## 2022-07-03 ENCOUNTER — Ambulatory Visit: Payer: Medicare Other | Admitting: Physical Therapy

## 2022-07-03 DIAGNOSIS — M6281 Muscle weakness (generalized): Secondary | ICD-10-CM

## 2022-07-03 DIAGNOSIS — M25552 Pain in left hip: Secondary | ICD-10-CM

## 2022-07-03 DIAGNOSIS — R278 Other lack of coordination: Secondary | ICD-10-CM

## 2022-07-03 NOTE — Therapy (Signed)
OUTPATIENT PHYSICAL THERAPY FEMALE PELVIC TREATMENT   Patient Name: [name] MRN: [id number] DOB:19-Aug-1945, 77 y.o., female Today's Date: 07/03/2022  END OF SESSION:  PT End of Session - 07/03/22 1015     Visit Number 3    Date for PT Re-Evaluation 09/13/22    Authorization Type [employer] medicare    Authorization - Visit Number 3    Authorization - Number of Visits 10    PT Start Time 1015    PT Stop Time 1055    PT Time Calculation (min) 40 min    Activity Tolerance Patient tolerated treatment well    Behavior During Therapy [facility] for tasks assessed/performed             Past Medical History:  Diagnosis Date   Abnormal Pap smear 1995   [place]   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 being tested   Inflammatory polyps of colon with rectal bleeding (HCC) 12/96   Lichen planus    genital,gums,legs,back   Mitral valve prolapse    Osteopenia    Shingles 6/94   Shoulder fracture, left    Vitamin B12 deficiency 09/2016   Past Surgical History:  Procedure Laterality Date   COLONOSCOPY  04/08/2014   Polyps - repeat 5 years - Dr. Randa Evens    CYSTOURETHROSCOPY  Sacrospinous ligament suspension  Anterior repair  Cystoscopy   04/29/2022   NOSE SURGERY     nasal repair   SHOULDER ARTHROSCOPY Left 01/08/1996   TONSILLECTOMY AND ADENOIDECTOMY     as child   TOTAL HIP ARTHROPLASTY Left 01/08/2003   Patient Active Problem List   Diagnosis Date Noted   Pain in left hip 07/14/2019   Lichen planus 11/01/2016   Dyspnea 08/06/2012   Fatigue 08/06/2012   MVP (mitral valve prolapse) 08/06/2012   Female cystocele 04/01/2012   Rectocele 04/01/2012   Uterine prolapse 04/01/2012   Genuine stress incontinence, female 04/01/2012    PCP: Cleatis Polka., MD  REFERRING PROVIDER: Jerene Bears, MD     REFERRING DIAG: N39.3 (ICD-10-CM) - Genuine stress incontinence, female   THERAPY DIAG:  Muscle  weakness (generalized)  Other lack of coordination  Pain in left hip  Rationale for Evaluation and Treatment: Rehabilitation  ONSET DATE: 04/29/22  SUBJECTIVE:                                                                                                                                                                                           SUBJECTIVE STATEMENT: I  felt okay after last. I have been doing the exercises and they are helping.     PAIN:  Are you having pain? Yes: NPRS scale: 4-8/10 Pain location: left hip Pain description: achy, sharp, intermittent Aggravating factors: cough, sneeze, increased activity Relieving factors: nothing    PRECAUTIONS: left posterior hip replacement  WEIGHT BEARING RESTRICTIONS: No  FALLS:  Has patient fallen in last 6 months? No  LIVING ENVIRONMENT: Lives with: lives alone  OCCUPATION: retired, no exercise  PLOF: Independent  PATIENT GOALS: reduce urinary leakage and anal leakage, how to lift properly  PERTINENT HISTORY:  Lichen planus; osteopenia; divertivulitis; Left Total hip arthroplasty;  CYSTOURETHROSCOPY sacrospinous ligament suspension anterior repair Sacrospinous ligament suspension  Anterior repair  Cystoscopy 04/29/22   BOWEL MOVEMENT: Pain with bowel movement: No Type of bowel movement:Type (Bristol Stool Scale) Type 4, Frequency daily, Strain No, and Splinting no ; every 3-4 weeks has severe diarrhea and comes on suddenly and possible cramps in the middle of the day Fully empty rectum: No, later in day will have gas with some stool coming out Leakage: Yes:   Pads: Yes: panty liner Fiber supplement: No, eats a lot of fiber  URINATION: Pain with urination: No Fully empty bladder: No wait after urination and then post void dribble Stream: Strong Urgency: Yes: less since surgery Frequency: every 2-3 hours Leakage: Coughing, Lifting, and possible gardening Pads: Yes: pantyliner  INTERCOURSE: not sexually  active   PREGNANCY: Vaginal deliveries 1 Tearing No   PROLAPSE: Corrected with surgery   OBJECTIVE:   DIAGNOSTIC FINDINGS:  none   COGNITION: Overall cognitive status: Within functional limits for tasks assessed     SENSATION: Light touch: Appears intact Proprioception: Appears intact   POSTURE: No Significant postural limitations  PELVIC ALIGNMENT: correct alignment  LUMBARAROM/PROM: full lumbar ROM    LOWER EXTREMITY MMT:  MMT Right eval Left eval  Hip flexion 5/5 4/5 with pain  Hip extension 4/5 with pain in left hip 5/5  Hip abduction 4/5 3+/5 with pain  Hip adduction 5/5 5/5   PALPATION:   General  tender in left inguinal area, along the left iliopsoas, and left upper quadrant                External Perineal Exam intact                             Internal Pelvic Floor tenderness located on the levator ani and obturator internist, tightness of the anococcygeal ligament  Patient confirms identification and approves PT to assess internal pelvic floor and treatment Yes  PELVIC MMT:   MMT eval 06/24/22 07/03/22  Vaginal 2/5 except for left side that is 1/5 2/5 circular contraction    Internal Anal Sphincter 2/5  3/5  External Anal Sphincter 2/5  3/5  Puborectalis 1/5  3/5  (Blank rows = not tested)        TONE: average  PROLAPSE: none  TODAY'S TREATMENT:    07/03/22 Manual: Soft tissue mobilization: Manual work to the left hamstring, left hip adductors, left psoas, left rectus femorous to elongate after dry needling To assess for dry needling Manual work to the perineal body to improve restrictions Manual work outside the anus to release the pelvic floor tissues Internal pelvic floor techniques: No emotional/communication barriers or cognitive limitation. Patient is motivated to learn. Patient understands and agrees with treatment goals and plan. PT explains patient will be examined in standing, sitting, and  lying down to see how their  muscles and joints work. When they are ready, they will be asked to remove their underwear so PT can examine their perineum. The patient is also given the option of providing their own chaperone as one is not provided in our facility. The patient also has the right and is explained the right to defer or refuse any part of the evaluation or treatment including the internal exam. With the patient's consent, PT will use one gloved finger to gently assess the muscles of the pelvic floor, seeing how well it contracts and relaxes and if there is muscle symmetry. After, the patient will get dressed and PT and patient will discuss exam findings and plan of care. PT and patient discuss plan of care, schedule, attendance policy and HEP activities.  Going through the anus working on the levator ani and obturator internist to elongate and reduce trigger points Trigger Point Dry-Needling  Treatment instructions: Expect mild to moderate muscle soreness. S/S of pneumothorax if dry needled over a lung field, and to seek immediate medical attention should they occur. Patient verbalized understanding of these instructions and education.  Patient Consent Given: Yes Education handout provided: Yes Muscles treated: left psoas,left hip adductor, left rectus femorous; Left hamstring Electrical stimulation performed: No Parameters: N/A Treatment response/outcome: elongation of muscle and trigger point response  Neuromuscular re-education: Pelvic floor contraction training: Therapist finger in the rectum working on circular contraction with lift, then added hip flexion isometric to engage the lower abdominals with contraction in sidely    06/24/22 Manual: Myofascial release: Urogenital release going through the layers of fascial Internal pelvic floor techniques: No emotional/communication barriers or cognitive limitation. Patient is motivated to learn. Patient understands and agrees with treatment goals and plan. PT  explains patient will be examined in standing, sitting, and lying down to see how their muscles and joints work. When they are ready, they will be asked to remove their underwear so PT can examine their perineum. The patient is also given the option of providing their own chaperone as one is not provided in our facility. The patient also has the right and is explained the right to defer or refuse any part of the evaluation or treatment including the internal exam. With the patient's consent, PT will use one gloved finger to gently assess the muscles of the pelvic floor, seeing how well it contracts and relaxes and if there is muscle symmetry. After, the patient will get dressed and PT and patient will discuss exam findings and plan of care. PT and patient discuss plan of care, schedule, attendance policy and HEP activities.  Going through the vaginal canal working on the introitus, levator ani and obturator internist Neuromuscular re-education: Pelvic floor contraction training: Therapist finger in the vaginal canal working an a circular contraction with tactile cues to the lateral wall of the introitus Exercises: Strengthening: Dead lift without weights and with 5# Supine bride with ball squeeze and pelvic floor contraction 15 x Supine march with ball between knees with ball squeeze and pelvic floor contraction 10 x each side Therapeutic activities: Functional strengthening activities: Educated patient on correct lifting with keeping the distance between lower rib cage and pubic bone, breathing out, flexing at hips and knees  PATIENT EDUCATION: 07/03/22 Education details: Access Code: [email or web address], lifting correctly Person educated: Patient Education method: Explanation, Demonstration, Tactile cues, Verbal cues, and Handouts Education comprehension: verbalized understanding, returned demonstration, verbal cues required, tactile cues required, and needs further education   HOME  EXERCISE  PROGRAM: 07/03/22 Access Code: [email or web address] URL: [email or web address] Date: 07/03/2022 Prepared by: Eulis Foster  Exercises - Supine Bridge with [employer] Between Knees  - 1 x daily - 7 x weekly - 1 sets - 10 reps - Supine March  - 1 x daily - 7 x weekly - 1 sets - 10 reps - Supine Pelvic Floor Contraction  - 3 x daily - 7 x weekly - 1 sets - 10 reps - 5 sec hold  Patient Education - Trigger Point Dry Needling    ASSESSMENT:  CLINICAL IMPRESSION: Patient is a 77 y.o. female who was seen today for physical therapy  treatment for stress incontinence.Patient started to have urinary leakage with coughing, laughing, and increased activity. She has a repair of a cystocele on 04/29/22. Urinary leakage is improved by 75%. She has not had rectal leakage in the past few days.  Rectal contraction is 3/5 with lift. She needed cues to engage the lower abdominals for the lift. Left hip pain decreased to 3/5. She still has pain with left hip flexion in the groin. Patient would benefit from skilled therapy to improve pelvic floor coordination and strength and reduce left hip pain.    OBJECTIVE IMPAIRMENTS: decreased activity tolerance, decreased coordination, decreased strength, increased fascial restrictions, increased muscle spasms, and pain.   ACTIVITY LIMITATIONS: carrying, lifting, squatting, continence, and locomotion level  PARTICIPATION LIMITATIONS: cleaning, laundry, community activity, and yard work  PERSONAL FACTORS: Age, Time since onset of injury/illness/exacerbation, and 3+ comorbidities:    Lichen planus; osteopenia; divertivulitis; Left Total hip arthroplasty;  CYSTOURETHROSCOPY sacrospinous ligament suspension anterior repair 04/29/22 are also affecting patient's functional outcome.   REHAB POTENTIAL: Excellent  CLINICAL DECISION MAKING: Stable/uncomplicated  EVALUATION COMPLEXITY: Low   GOALS: Goals reviewed with patient? Yes  SHORT TERM GOALS: Target date:  07/18/22  Patient independent with initial HEP for pelvic floor strength.  Baseline: Goal status: INITIAL  2.  Patient able to perform circular contraction of the vaginal canal and rectum due to reduction of restrictions of the muscles and fascia.  Baseline: can with the rectum Goal status: INITIAL  3.  Patient reports her left hip pain decreased >/= 25% due to decreased trigger points in the pelvic floor muscles.  Baseline:  Goal status: INITIAL   LONG TERM GOALS: Target date: 09/13/22  Patient independent with advanced HEP for core, hip and pelvic floor to reduce leakage.  Baseline:  Goal status: INITIAL  2.  Vaginal strength increased >/= 3/5  so her urinary leakage with coughing and sneezing decreased >/= 75%.  Baseline:  Goal status: INITIAL  3.  Rectal strength >/= 3/5 holding for 30 seconds to reduce fecal leakage with gas >/= 75%.  Baseline:  Goal status: INITIAL  4.  Patient understands how to lift correctly to reduce strain on the pelvic floor.  Baseline:  Goal status: INITIAL   PLAN:  PT FREQUENCY: 1x/week  PT DURATION: 12 weeks  PLANNED INTERVENTIONS: Therapeutic exercises, Therapeutic activity, Neuromuscular re-education, Patient/Family education, Joint mobilization, Dry Needling, Electrical stimulation, Cryotherapy, Moist heat, Taping, Ultrasound, Biofeedback, and Manual therapy  PLAN FOR NEXT SESSION: manual work to vagina for circular and lift contraction, left hip flexion exercises with core engaged,  pelvic floor exercises, lifting, dry needle of the left hip if still needs it   Eulis Foster, PT 07/03/22 11:04 AM

## 2022-07-08 ENCOUNTER — Encounter: Payer: Self-pay | Admitting: Physical Therapy

## 2022-07-08 ENCOUNTER — Ambulatory Visit: Payer: Medicare Other | Attending: Obstetrics & Gynecology | Admitting: Physical Therapy

## 2022-07-08 DIAGNOSIS — M25552 Pain in left hip: Secondary | ICD-10-CM | POA: Diagnosis present

## 2022-07-08 DIAGNOSIS — R278 Other lack of coordination: Secondary | ICD-10-CM | POA: Insufficient documentation

## 2022-07-08 DIAGNOSIS — M6281 Muscle weakness (generalized): Secondary | ICD-10-CM | POA: Diagnosis not present

## 2022-07-08 NOTE — Therapy (Signed)
OUTPATIENT PHYSICAL THERAPY FEMALE PELVIC TREATMENT   Patient Name: [name] MRN: [id number] DOB:1945-03-30, 77 y.o., female Today's Date: 07/08/2022  END OF SESSION:  PT End of Session - 07/08/22 1012     Visit Number 4    Date for PT Re-Evaluation 09/13/22    Authorization Type [employer] medicare    Authorization - Visit Number 4    Authorization - Number of Visits 10    PT Start Time 1015    PT Stop Time 1055    PT Time Calculation (min) 40 min    Activity Tolerance Patient tolerated treatment well    Behavior During Therapy [facility] for tasks assessed/performed             Past Medical History:  Diagnosis Date   Abnormal Pap smear 1995   [place]   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 being tested   Inflammatory polyps of colon with rectal bleeding (HCC) 12/96   Lichen planus    genital,gums,legs,back   Mitral valve prolapse    Osteopenia    Shingles 6/94   Shoulder fracture, left    Vitamin B12 deficiency 09/2016   Past Surgical History:  Procedure Laterality Date   COLONOSCOPY  04/08/2014   Polyps - repeat 5 years - Dr. Randa Evens    CYSTOURETHROSCOPY  Sacrospinous ligament suspension  Anterior repair  Cystoscopy   04/29/2022   NOSE SURGERY     nasal repair   SHOULDER ARTHROSCOPY Left 01/08/1996   TONSILLECTOMY AND ADENOIDECTOMY     as child   TOTAL HIP ARTHROPLASTY Left 01/08/2003   Patient Active Problem List   Diagnosis Date Noted   Pain in left hip 07/14/2019   Lichen planus 11/01/2016   Dyspnea 08/06/2012   Fatigue 08/06/2012   MVP (mitral valve prolapse) 08/06/2012   Female cystocele 04/01/2012   Rectocele 04/01/2012   Uterine prolapse 04/01/2012   Genuine stress incontinence, female 04/01/2012    PCP: Cleatis Polka., MD  REFERRING PROVIDER: Jerene Bears, MD     REFERRING DIAG: N39.3 (ICD-10-CM) - Genuine stress incontinence, female   THERAPY DIAG:  Muscle  weakness (generalized)  Other lack of coordination  Pain in left hip  Rationale for Evaluation and Treatment: Rehabilitation  ONSET DATE: 04/29/22  SUBJECTIVE:                                                                                                                                                                                           SUBJECTIVE STATEMENT: I  have had less pain in my hip after dry needling. I have not leaked as much. No rectal leakage since last visit.      PAIN:  Are you having pain? Yes: NPRS scale: 4-8/10 Pain location: left hip Pain description: achy, sharp, intermittent Aggravating factors: cough, sneeze, increased activity Relieving factors: nothing    PRECAUTIONS: left posterior hip replacement  WEIGHT BEARING RESTRICTIONS: No  FALLS:  Has patient fallen in last 6 months? No  LIVING ENVIRONMENT: Lives with: lives alone  OCCUPATION: retired, no exercise  PLOF: Independent  PATIENT GOALS: reduce urinary leakage and anal leakage, how to lift properly  PERTINENT HISTORY:  Lichen planus; osteopenia; divertivulitis; Left Total hip arthroplasty;  CYSTOURETHROSCOPY sacrospinous ligament suspension anterior repair Sacrospinous ligament suspension  Anterior repair  Cystoscopy 04/29/22   BOWEL MOVEMENT: Pain with bowel movement: No Type of bowel movement:Type (Bristol Stool Scale) Type 4, Frequency daily, Strain No, and Splinting no ; every 3-4 weeks has severe diarrhea and comes on suddenly and possible cramps in the middle of the day Fully empty rectum: No, later in day will have gas with some stool coming out Leakage: Yes:   Pads: Yes: panty liner Fiber supplement: No, eats a lot of fiber  URINATION: Pain with urination: No Fully empty bladder: No wait after urination and then post void dribble Stream: Strong Urgency: Yes: less since surgery Frequency: every 2-3 hours Leakage: Coughing, Lifting, and possible gardening Pads: Yes:  pantyliner  INTERCOURSE: not sexually active   PREGNANCY: Vaginal deliveries 1 Tearing No   PROLAPSE: Corrected with surgery   OBJECTIVE:   DIAGNOSTIC FINDINGS:  none   COGNITION: Overall cognitive status: Within functional limits for tasks assessed     SENSATION: Light touch: Appears intact Proprioception: Appears intact   POSTURE: No Significant postural limitations  PELVIC ALIGNMENT: correct alignment  LUMBARAROM/PROM: full lumbar ROM    LOWER EXTREMITY MMT:  MMT Right eval Left eval  Hip flexion 5/5 4/5 with pain  Hip extension 4/5 with pain in left hip 5/5  Hip abduction 4/5 3+/5 with pain  Hip adduction 5/5 5/5   PALPATION:   General  tender in left inguinal area, along the left iliopsoas, and left upper quadrant                External Perineal Exam intact                             Internal Pelvic Floor tenderness located on the levator ani and obturator internist, tightness of the anococcygeal ligament  Patient confirms identification and approves PT to assess internal pelvic floor and treatment Yes  PELVIC MMT:   MMT eval 06/24/22 07/03/22 07/08/22  Vaginal 2/5 except for left side that is 1/5 2/5 circular contraction   2/5 3 times and 3/5 4 times  Internal Anal Sphincter 2/5  3/5   External Anal Sphincter 2/5  3/5   Puborectalis 1/5  3/5   (Blank rows = not tested)        TONE: average  PROLAPSE: none  TODAY'S TREATMENT:    07/08/22 Manual: Soft tissue mobilization: Manual work to the left hamstring, left hip adductors, to elongate after dry needling To assess for dry needling Internal pelvic floor techniques: No emotional/communication barriers or cognitive limitation. Patient is motivated to learn. Patient understands and agrees with treatment goals and plan. PT explains patient will be examined in standing, sitting, and lying down to see how  their muscles and joints work. When they are ready, they will be asked to remove their  underwear so PT can examine their perineum. The patient is also given the option of providing their own chaperone as one is not provided in our facility. The patient also has the right and is explained the right to defer or refuse any part of the evaluation or treatment including the internal exam. With the patient's consent, PT will use one gloved finger to gently assess the muscles of the pelvic floor, seeing how well it contracts and relaxes and if there is muscle symmetry. After, the patient will get dressed and PT and patient will discuss exam findings and plan of care. PT and patient discuss plan of care, schedule, attendance policy and HEP activities.  Going through the vaginal canal and working on the left levator ani, obturator internist, and perineal body Trigger Point Dry-Needling  Treatment instructions: Expect mild to moderate muscle soreness. S/S of pneumothorax if dry needled over a lung field, and to seek immediate medical attention should they occur. Patient verbalized understanding of these instructions and education.  Patient Consent Given: Yes Education handout provided: Yes Muscles treated: left hip adductor,  Left hamstring Electrical stimulation performed: No Parameters: N/A Treatment response/outcome: elongation of muscle and trigger point response  Neuromuscular re-education: Pelvic floor contraction training:  Therapist finger in the vaginal canal working on contraction with a lift then lower abdominal contraction Supine bridge with ball squeeze and pelvic floor contraction 15 x Supine hip isometric with ball squeeze, lower abdominal contraction and pelvic floor contraction Supine march with ball between knees with ball squeeze and pelvic floor contraction 10 x each side but on the left side did not use the ball due to increased in left groin pain     07/03/22 Manual: Soft tissue mobilization: Manual work to the left hamstring, left hip adductors, left psoas, left  rectus femorous to elongate after dry needling To assess for dry needling Manual work to the perineal body to improve restrictions Manual work outside the anus to release the pelvic floor tissues Internal pelvic floor techniques: No emotional/communication barriers or cognitive limitation. Patient is motivated to learn. Patient understands and agrees with treatment goals and plan. PT explains patient will be examined in standing, sitting, and lying down to see how their muscles and joints work. When they are ready, they will be asked to remove their underwear so PT can examine their perineum. The patient is also given the option of providing their own chaperone as one is not provided in our facility. The patient also has the right and is explained the right to defer or refuse any part of the evaluation or treatment including the internal exam. With the patient's consent, PT will use one gloved finger to gently assess the muscles of the pelvic floor, seeing how well it contracts and relaxes and if there is muscle symmetry. After, the patient will get dressed and PT and patient will discuss exam findings and plan of care. PT and patient discuss plan of care, schedule, attendance policy and HEP activities.  Going through the anus working on the levator ani and obturator internist to elongate and reduce trigger points Trigger Point Dry-Needling  Treatment instructions: Expect mild to moderate muscle soreness. S/S of pneumothorax if dry needled over a lung field, and to seek immediate medical attention should they occur. Patient verbalized understanding of these instructions and education.  Patient Consent Given: Yes Education handout provided: Yes Muscles treated: left psoas,left  hip adductor, left rectus femorous; Left hamstring Electrical stimulation performed: No Parameters: N/A Treatment response/outcome: elongation of muscle and trigger point response  Neuromuscular re-education: Pelvic floor  contraction training: Therapist finger in the rectum working on circular contraction with lift, then added hip flexion isometric to engage the lower abdominals with contraction in sidely    06/24/22 Manual: Myofascial release: Urogenital release going through the layers of fascial Internal pelvic floor techniques: No emotional/communication barriers or cognitive limitation. Patient is motivated to learn. Patient understands and agrees with treatment goals and plan. PT explains patient will be examined in standing, sitting, and lying down to see how their muscles and joints work. When they are ready, they will be asked to remove their underwear so PT can examine their perineum. The patient is also given the option of providing their own chaperone as one is not provided in our facility. The patient also has the right and is explained the right to defer or refuse any part of the evaluation or treatment including the internal exam. With the patient's consent, PT will use one gloved finger to gently assess the muscles of the pelvic floor, seeing how well it contracts and relaxes and if there is muscle symmetry. After, the patient will get dressed and PT and patient will discuss exam findings and plan of care. PT and patient discuss plan of care, schedule, attendance policy and HEP activities.  Going through the vaginal canal working on the introitus, levator ani and obturator internist Neuromuscular re-education: Pelvic floor contraction training: Therapist finger in the vaginal canal working an a circular contraction with tactile cues to the lateral wall of the introitus Exercises: Strengthening: Dead lift without weights and with 5# Supine bride with ball squeeze and pelvic floor contraction 15 x Supine march with ball between knees with ball squeeze and pelvic floor contraction 10 x each side Therapeutic activities: Functional strengthening activities: Educated patient on correct lifting with  keeping the distance between lower rib cage and pubic bone, breathing out, flexing at hips and knees  PATIENT EDUCATION: 07/03/22 Education details: Access Code: [email or web address], lifting correctly Person educated: Patient Education method: Explanation, Demonstration, Tactile cues, Verbal cues, and Handouts Education comprehension: verbalized understanding, returned demonstration, verbal cues required, tactile cues required, and needs further education   HOME EXERCISE PROGRAM: 07/03/22 Access Code: [email or web address] URL: [email or web address] Date: 07/03/2022 Prepared by: Eulis Foster  Exercises - Supine Bridge with [employer] Between Knees  - 1 x daily - 7 x weekly - 1 sets - 10 reps - Supine March  - 1 x daily - 7 x weekly - 1 sets - 10 reps - Supine Pelvic Floor Contraction  - 3 x daily - 7 x weekly - 1 sets - 10 reps - 5 sec hold  Patient Education - Trigger Point Dry Needling    ASSESSMENT:  CLINICAL IMPRESSION: Patient is a 77 y.o. female who was seen today for physical therapy  treatment for stress incontinence. No rectal leakage since last visit. Ess urinary leakage since last visit.  Pelvic floor strength was 3/5 after manual work then the muscles fatigued and decreased to 2/5. Patient has difficulty with feeling the lower abdominal contraction. Her pain does not come as often but still has the same intensity. Patient had less trigger points in the left hip adductor and hamstring. Patient would benefit from skilled therapy to improve pelvic floor coordination and strength and reduce left hip pain.    OBJECTIVE IMPAIRMENTS: decreased activity tolerance, decreased coordination, decreased strength,  increased fascial restrictions, increased muscle spasms, and pain.   ACTIVITY LIMITATIONS: carrying, lifting, squatting, continence, and locomotion level  PARTICIPATION LIMITATIONS: cleaning, laundry, community activity, and yard work  PERSONAL FACTORS: Age, Time since onset of  injury/illness/exacerbation, and 3+ comorbidities:    Lichen planus; osteopenia; divertivulitis; Left Total hip arthroplasty;  CYSTOURETHROSCOPY sacrospinous ligament suspension anterior repair 04/29/22 are also affecting patient's functional outcome.   REHAB POTENTIAL: Excellent  CLINICAL DECISION MAKING: Stable/uncomplicated  EVALUATION COMPLEXITY: Low   GOALS: Goals reviewed with patient? Yes  SHORT TERM GOALS: Target date: 07/18/22  Patient independent with initial HEP for pelvic floor strength.  Baseline: Goal status: Met 07/08/22  2.  Patient able to perform circular contraction of the vaginal canal and rectum due to reduction of restrictions of the muscles and fascia.  Baseline: can with the rectum and vaginally Goal status: Met 07/08/22  3.  Patient reports her left hip pain decreased >/= 25% due to decreased trigger points in the pelvic floor muscles.  Baseline:  Goal status: INITIAL   LONG TERM GOALS: Target date: 09/13/22  Patient independent with advanced HEP for core, hip and pelvic floor to reduce leakage.  Baseline:  Goal status: INITIAL  2.  Vaginal strength increased >/= 3/5  so her urinary leakage with coughing and sneezing decreased >/= 75%.  Baseline:  Goal status: INITIAL  3.  Rectal strength >/= 3/5 holding for 30 seconds to reduce fecal leakage with gas >/= 75%.  Baseline:  Goal status: INITIAL  4.  Patient understands how to lift correctly to reduce strain on the pelvic floor.  Baseline:  Goal status: INITIAL   PLAN:  PT FREQUENCY: 1x/week  PT DURATION: 12 weeks  PLANNED INTERVENTIONS: Therapeutic exercises, Therapeutic activity, Neuromuscular re-education, Patient/Family education, Joint mobilization, Dry Needling, Electrical stimulation, Cryotherapy, Moist heat, Taping, Ultrasound, Biofeedback, and Manual therapy  PLAN FOR NEXT SESSION: , left hip flexion exercises with core engaged,  pelvic floor exercises, lifting, dry needle of the left hip  if still needs it   Eulis Foster, PT 07/08/22 11:00 AM

## 2022-07-15 ENCOUNTER — Ambulatory Visit: Payer: Medicare Other | Admitting: Physical Therapy

## 2022-07-15 ENCOUNTER — Encounter: Payer: Self-pay | Admitting: Physical Therapy

## 2022-07-15 DIAGNOSIS — R278 Other lack of coordination: Secondary | ICD-10-CM

## 2022-07-15 DIAGNOSIS — M6281 Muscle weakness (generalized): Secondary | ICD-10-CM

## 2022-07-15 DIAGNOSIS — M25552 Pain in left hip: Secondary | ICD-10-CM

## 2022-07-15 NOTE — Therapy (Signed)
OUTPATIENT PHYSICAL THERAPY FEMALE PELVIC TREATMENT   Patient Name: [name] MRN: [id number] DOB:[birth year], 77 y.o., female Today's Date: 07/15/2022  END OF SESSION:  PT End of Session - 07/15/22 0841     Visit Number 5    Date for PT Re-Evaluation 09/13/22    Authorization Type [employer] medicare    Authorization - Visit Number 5    Authorization - Number of Visits 10    PT Start Time 0845    PT Stop Time 0925    PT Time Calculation (min) 40 min    Activity Tolerance Patient tolerated treatment well    Behavior During Therapy [facility] for tasks assessed/performed             Past Medical History:  Diagnosis Date   Abnormal Pap smear 1995   [place]   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 being tested   Inflammatory polyps of colon with rectal bleeding (HCC) 12/96   Lichen planus    genital,gums,legs,back   Mitral valve prolapse    Osteopenia    Shingles 6/94   Shoulder fracture, left    Vitamin B12 deficiency 09/2016   Past Surgical History:  Procedure Laterality Date   COLONOSCOPY  04/08/2014   Polyps - repeat 5 years - Dr. Randa Evens    CYSTOURETHROSCOPY  Sacrospinous ligament suspension  Anterior repair  Cystoscopy   04/29/2022   NOSE SURGERY     nasal repair   SHOULDER ARTHROSCOPY Left 01/08/1996   TONSILLECTOMY AND ADENOIDECTOMY     as child   TOTAL HIP ARTHROPLASTY Left 01/08/2003   Patient Active Problem List   Diagnosis Date Noted   Pain in left hip 07/14/2019   Lichen planus 11/01/2016   Dyspnea 08/06/2012   Fatigue 08/06/2012   MVP (mitral valve prolapse) 08/06/2012   Female cystocele 04/01/2012   Rectocele 04/01/2012   Uterine prolapse 04/01/2012   Genuine stress incontinence, female 04/01/2012    PCP: Cleatis Polka., MD  REFERRING PROVIDER: Jerene Bears, MD     REFERRING DIAG: N39.3 (ICD-10-CM) - Genuine stress incontinence, female   THERAPY DIAG:  Muscle  weakness (generalized)  Other lack of coordination  Pain in left hip  Rationale for Evaluation and Treatment: Rehabilitation  ONSET DATE: 04/29/22  SUBJECTIVE:                                                                                                                                                                                           SUBJECTIVE STATEMENT: I  have not had rectal leakage for several weeks. I do not feel I had urinary leakage since last visit. The left hip pain has been better.     PAIN:  Are you having pain? Yes: NPRS scale: 4-8/10 Pain location: left hip Pain description: achy, sharp, intermittent Aggravating factors: cough, sneeze, increased activity Relieving factors: nothing    PRECAUTIONS: left posterior hip replacement  WEIGHT BEARING RESTRICTIONS: No  FALLS:  Has patient fallen in last 6 months? No  LIVING ENVIRONMENT: Lives with: lives alone  OCCUPATION: retired, no exercise  PLOF: Independent  PATIENT GOALS: reduce urinary leakage and anal leakage, how to lift properly  PERTINENT HISTORY:  Lichen planus; osteopenia; divertivulitis; Left Total hip arthroplasty;  CYSTOURETHROSCOPY sacrospinous ligament suspension anterior repair Sacrospinous ligament suspension  Anterior repair  Cystoscopy 04/29/22   BOWEL MOVEMENT: Pain with bowel movement: No Type of bowel movement:Type (Bristol Stool Scale) Type 4, Frequency daily, Strain No, and Splinting no ; every 3-4 weeks has severe diarrhea and comes on suddenly and possible cramps in the middle of the day Fully empty rectum: No, l Leakage: Yes:   Pads: Yes: panty liner Fiber supplement: No, eats a lot of fiber  URINATION: Pain with urination: No Fully empty bladder: No wait after urination and then post void dribble Stream: Strong Urgency: Yes: less since surgery Frequency: every 2-3 hours Leakage: Coughing, Lifting, and possible gardening Pads: Yes: pantyliner  INTERCOURSE: not  sexually active   PREGNANCY: Vaginal deliveries 1 Tearing No   PROLAPSE: Corrected with surgery   OBJECTIVE:   DIAGNOSTIC FINDINGS:  none   COGNITION: Overall cognitive status: Within functional limits for tasks assessed     SENSATION: Light touch: Appears intact Proprioception: Appears intact   POSTURE: No Significant postural limitations  PELVIC ALIGNMENT: correct alignment  LUMBARAROM/PROM: full lumbar ROM    LOWER EXTREMITY MMT:  MMT Right eval Left eval  Hip flexion 5/5 4/5 with pain  Hip extension 4/5 with pain in left hip 5/5  Hip abduction 4/5 3+/5 with pain  Hip adduction 5/5 5/5   PALPATION:   General  tender in left inguinal area, along the left iliopsoas, and left upper quadrant                External Perineal Exam intact                             Internal Pelvic Floor tenderness located on the levator ani and obturator internist, tightness of the anococcygeal ligament  Patient confirms identification and approves PT to assess internal pelvic floor and treatment Yes  PELVIC MMT:   MMT eval 06/24/22 07/03/22 07/08/22  Vaginal 2/5 except for left side that is 1/5 2/5 circular contraction   2/5 3 times and 3/5 4 times  Internal Anal Sphincter 2/5  3/5   External Anal Sphincter 2/5  3/5   Puborectalis 1/5  3/5   (Blank rows = not tested)        TONE: average  PROLAPSE: none  TODAY'S TREATMENT:  07/15/22 Exercises: Strengthening: Supine bridge with ball squeeze and pelvic floor contraction 5 x with good control Supine bridge with lifting her heels 10 x Supine march with yellow band around the knees with pelvic floor contraction 10 x  Leaning on counter top lifting one leg keep spinal neutral 10 x each leg Squat holding for 10 sec with pelvic floor and core contraction Nu step level 5 with verbal cues  to not leg knees go inward for 6 minutes while assessing patient Standing holding a 5# wt and march Standing placing foot on BOSU ball  and come back to standing    07/08/22 Manual: Soft tissue mobilization: Manual work to the left hamstring, left hip adductors, to elongate after dry needling To assess for dry needling Internal pelvic floor techniques: No emotional/communication barriers or cognitive limitation. Patient is motivated to learn. Patient understands and agrees with treatment goals and plan. PT explains patient will be examined in standing, sitting, and lying down to see how their muscles and joints work. When they are ready, they will be asked to remove their underwear so PT can examine their perineum. The patient is also given the option of providing their own chaperone as one is not provided in our facility. The patient also has the right and is explained the right to defer or refuse any part of the evaluation or treatment including the internal exam. With the patient's consent, PT will use one gloved finger to gently assess the muscles of the pelvic floor, seeing how well it contracts and relaxes and if there is muscle symmetry. After, the patient will get dressed and PT and patient will discuss exam findings and plan of care. PT and patient discuss plan of care, schedule, attendance policy and HEP activities.  Going through the vaginal canal and working on the left levator ani, obturator internist, and perineal body Trigger Point Dry-Needling  Treatment instructions: Expect mild to moderate muscle soreness. S/S of pneumothorax if dry needled over a lung field, and to seek immediate medical attention should they occur. Patient verbalized understanding of these instructions and education.  Patient Consent Given: Yes Education handout provided: Yes Muscles treated: left hip adductor,  Left hamstring Electrical stimulation performed: No Parameters: N/A Treatment response/outcome: elongation of muscle and trigger point response  Neuromuscular re-education: Pelvic floor contraction training:  Therapist finger in the  vaginal canal working on contraction with a lift then lower abdominal contraction Supine bridge with ball squeeze and pelvic floor contraction 15 x Supine hip isometric with ball squeeze, lower abdominal contraction and pelvic floor contraction Supine march with ball between knees with ball squeeze and pelvic floor contraction 10 x each side but on the left side did not use the ball due to increased in left groin pain     07/03/22 Manual: Soft tissue mobilization: Manual work to the left hamstring, left hip adductors, left psoas, left rectus femorous to elongate after dry needling To assess for dry needling Manual work to the perineal body to improve restrictions Manual work outside the anus to release the pelvic floor tissues Internal pelvic floor techniques: No emotional/communication barriers or cognitive limitation. Patient is motivated to learn. Patient understands and agrees with treatment goals and plan. PT explains patient will be examined in standing, sitting, and lying down to see how their muscles and joints work. When they are ready, they will be asked to remove their underwear so PT can examine their perineum. The patient is also given the option of providing their own chaperone as one is not provided in our facility. The patient also has the right and is explained the right to defer or refuse any part of the evaluation or treatment including the internal exam. With the patient's consent, PT will use one gloved finger to gently assess the muscles of the pelvic floor, seeing how well it contracts and relaxes and if there is muscle symmetry. After, the patient will get dressed  and PT and patient will discuss exam findings and plan of care. PT and patient discuss plan of care, schedule, attendance policy and HEP activities.  Going through the anus working on the levator ani and obturator internist to elongate and reduce trigger points Trigger Point Dry-Needling  Treatment instructions:  Expect mild to moderate muscle soreness. S/S of pneumothorax if dry needled over a lung field, and to seek immediate medical attention should they occur. Patient verbalized understanding of these instructions and education.  Patient Consent Given: Yes Education handout provided: Yes Muscles treated: left psoas,left hip adductor, left rectus femorous; Left hamstring Electrical stimulation performed: No Parameters: N/A Treatment response/outcome: elongation of muscle and trigger point response  Neuromuscular re-education: Pelvic floor contraction training: Therapist finger in the rectum working on circular contraction with lift, then added hip flexion isometric to engage the lower abdominals with contraction in sidely     PATIENT EDUCATION: 07/15/22 Education details: Access Code: [email or web address], lifting correctly Person educated: Patient Education method: Explanation, Demonstration, Tactile cues, Verbal cues, and Handouts Education comprehension: verbalized understanding, returned demonstration, verbal cues required, tactile cues required, and needs further education   HOME EXERCISE PROGRAM: 07/15/22 Access Code: [email or web address] URL: [email or web address] Date: 07/15/2022 Prepared by: Eulis Foster  Exercises - Supine Pelvic Floor Contraction  - 3 x daily - 7 x weekly - 1 sets - 10 reps - 5 sec hold - Supine Bridge  - 1 x daily - 7 x weekly - 1 sets - 10 reps - Supine March with Resistance Band  - 1 x daily - 7 x weekly - 2 sets - 10 reps - Bird Dog on Counter  - 1 x daily - 7 x weekly - 3 sets - 10 reps - Mini Squat with Chair  - 1 x daily - 7 x weekly - 1 sets - 10 reps - 5 sec hold  Patient Education - Trigger Point Dry Needling    ASSESSMENT:  CLINICAL IMPRESSION: Patient is a 77 y.o. female who was seen today for physical therapy  treatment for stress incontinence.  Patient has not had gas with some stool coming out No stool leakage since last visit or urinary leakage.  Patient will turn her left leg in with marching and needs verbal cues to keep knee outward. Patient would benefit from skilled therapy to improve pelvic floor coordination and strength and reduce left hip pain.    OBJECTIVE IMPAIRMENTS: decreased activity tolerance, decreased coordination, decreased strength, increased fascial restrictions, increased muscle spasms, and pain.   ACTIVITY LIMITATIONS: carrying, lifting, squatting, continence, and locomotion level  PARTICIPATION LIMITATIONS: cleaning, laundry, community activity, and yard work  PERSONAL FACTORS: Age, Time since onset of injury/illness/exacerbation, and 3+ comorbidities:    Lichen planus; osteopenia; divertivulitis; Left Total hip arthroplasty;  CYSTOURETHROSCOPY sacrospinous ligament suspension anterior repair 04/29/22 are also affecting patient's functional outcome.   REHAB POTENTIAL: Excellent  CLINICAL DECISION MAKING: Stable/uncomplicated  EVALUATION COMPLEXITY: Low   GOALS: Goals reviewed with patient? Yes  SHORT TERM GOALS: Target date: 07/18/22  Patient independent with initial HEP for pelvic floor strength.  Baseline: Goal status: Met 07/08/22  2.  Patient able to perform circular contraction of the vaginal canal and rectum due to reduction of restrictions of the muscles and fascia.  Baseline: can with the rectum and vaginally Goal status: Met 07/08/22  3.  Patient reports her left hip pain decreased >/= 25% due to decreased trigger points in the pelvic floor muscles.  Baseline:  Goal status: Met 07/15/22  LONG TERM GOALS: Target date: 09/13/22  Patient independent with advanced HEP for core, hip and pelvic floor to reduce leakage.  Baseline:  Goal status: INITIAL  2.  Vaginal strength increased >/= 3/5  so her urinary leakage with coughing and sneezing decreased >/= 75%.  Baseline:  Goal status: INITIAL  3.  Rectal strength >/= 3/5 holding for 30 seconds to reduce fecal leakage with gas >/= 75%.  Baseline:   Goal status: INITIAL  4.  Patient understands how to lift correctly to reduce strain on the pelvic floor.  Baseline:  Goal status: INITIAL   PLAN:  PT FREQUENCY: 1x/week  PT DURATION: 12 weeks  PLANNED INTERVENTIONS: Therapeutic exercises, Therapeutic activity, Neuromuscular re-education, Patient/Family education, Joint mobilization, Dry Needling, Electrical stimulation, Cryotherapy, Moist heat, Taping, Ultrasound, Biofeedback, and Manual therapy  PLAN FOR NEXT SESSION: left hip flexion in standing keeping with ER,   Eulis Foster, PT 07/15/22 8:42 AM

## 2022-07-22 ENCOUNTER — Encounter: Payer: Self-pay | Admitting: Physical Therapy

## 2022-07-22 ENCOUNTER — Ambulatory Visit: Payer: Medicare Other | Admitting: Physical Therapy

## 2022-07-22 DIAGNOSIS — M25552 Pain in left hip: Secondary | ICD-10-CM

## 2022-07-22 DIAGNOSIS — R278 Other lack of coordination: Secondary | ICD-10-CM

## 2022-07-22 DIAGNOSIS — M6281 Muscle weakness (generalized): Secondary | ICD-10-CM

## 2022-07-22 NOTE — Therapy (Signed)
OUTPATIENT PHYSICAL THERAPY FEMALE PELVIC TREATMENT   Patient Name: [name] MRN: [id number] DOB:04/05/1945, 77 y.o., female Today's Date: 07/22/2022  END OF SESSION:  PT End of Session - 07/22/22 1528     Visit Number 6    Date for PT Re-Evaluation 09/13/22    Authorization Type [employer] medicare    Authorization - Visit Number 6    Authorization - Number of Visits 10    PT Start Time 1530    PT Stop Time 1610    PT Time Calculation (min) 40 min    Activity Tolerance Patient tolerated treatment well    Behavior During Therapy [facility] for tasks assessed/performed             Past Medical History:  Diagnosis Date   Abnormal Pap smear 1995   [place]   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 being tested   Inflammatory polyps of colon with rectal bleeding (HCC) 12/96   Lichen planus    genital,gums,legs,back   Mitral valve prolapse    Osteopenia    Shingles 6/94   Shoulder fracture, left    Vitamin B12 deficiency 09/2016   Past Surgical History:  Procedure Laterality Date   COLONOSCOPY  04/08/2014   Polyps - repeat 5 years - Dr. Randa Evens    CYSTOURETHROSCOPY  Sacrospinous ligament suspension  Anterior repair  Cystoscopy   04/29/2022   NOSE SURGERY     nasal repair   SHOULDER ARTHROSCOPY Left 01/08/1996   TONSILLECTOMY AND ADENOIDECTOMY     as child   TOTAL HIP ARTHROPLASTY Left 01/08/2003   Patient Active Problem List   Diagnosis Date Noted   Pain in left hip 07/14/2019   Lichen planus 11/01/2016   Dyspnea 08/06/2012   Fatigue 08/06/2012   MVP (mitral valve prolapse) 08/06/2012   Female cystocele 04/01/2012   Rectocele 04/01/2012   Uterine prolapse 04/01/2012   Genuine stress incontinence, female 04/01/2012    PCP: Cleatis Polka., MD  REFERRING PROVIDER: Jerene Bears, MD     REFERRING DIAG: N39.3 (ICD-10-CM) - Genuine stress incontinence, female   THERAPY DIAG:  Muscle  weakness (generalized)  Other lack of coordination  Pain in left hip  Rationale for Evaluation and Treatment: Rehabilitation  ONSET DATE: 04/29/22  SUBJECTIVE:                                                                                                                                                                                           SUBJECTIVE STATEMENT: I  have had a little more leakage than last week due to not active that leakage. Leakage is 90% better since initial eval.       PAIN:  Are you having pain? Yes: NPRS scale: 4-8/10 Pain location: left hip Pain description: achy, sharp, intermittent Aggravating factors: cough, sneeze, increased activity Relieving factors: nothing    PRECAUTIONS: left posterior hip replacement  WEIGHT BEARING RESTRICTIONS: No  FALLS:  Has patient fallen in last 6 months? No  LIVING ENVIRONMENT: Lives with: lives alone  OCCUPATION: retired, no exercise  PLOF: Independent  PATIENT GOALS: reduce urinary leakage and anal leakage, how to lift properly  PERTINENT HISTORY:  Lichen planus; osteopenia; divertivulitis; Left Total hip arthroplasty;  CYSTOURETHROSCOPY sacrospinous ligament suspension anterior repair Sacrospinous ligament suspension  Anterior repair  Cystoscopy 04/29/22   BOWEL MOVEMENT: Pain with bowel movement: No Type of bowel movement:Type (Bristol Stool Scale) Type 4, Frequency daily, Strain No, and Splinting no ; every 3-4 weeks has severe diarrhea and comes on suddenly and possible cramps in the middle of the day Fully empty rectum: No, l Leakage: Yes:   Pads: Yes: panty liner Fiber supplement: No, eats a lot of fiber  URINATION: Pain with urination: No Fully empty bladder: No wait after urination and then post void dribble Stream: Strong Urgency: Yes: less since surgery Frequency: every 2-3 hours Leakage: Coughing, Lifting, and possible gardening Pads: Yes: pantyliner  INTERCOURSE: not sexually  active   PREGNANCY: Vaginal deliveries 1 Tearing No   PROLAPSE: Corrected with surgery   OBJECTIVE:   DIAGNOSTIC FINDINGS:  none   COGNITION: Overall cognitive status: Within functional limits for tasks assessed     SENSATION: Light touch: Appears intact Proprioception: Appears intact   POSTURE: No Significant postural limitations  PELVIC ALIGNMENT: correct alignment  LUMBARAROM/PROM: full lumbar ROM    LOWER EXTREMITY MMT:  MMT Right eval Left eval  Hip flexion 5/5 4/5 with pain  Hip extension 4/5 with pain in left hip 5/5  Hip abduction 4/5 3+/5 with pain  Hip adduction 5/5 5/5   PALPATION:   General  tender in left inguinal area, along the left iliopsoas, and left upper quadrant                External Perineal Exam intact                             Internal Pelvic Floor tenderness located on the levator ani and obturator internist, tightness of the anococcygeal ligament  Patient confirms identification and approves PT to assess internal pelvic floor and treatment Yes  PELVIC MMT:   MMT eval 06/24/22 07/03/22 07/08/22 07/22/22  Vaginal 2/5 except for left side that is 1/5 2/5 circular contraction   2/5 3 times and 3/5 4 times 3/5  Internal Anal Sphincter 2/5  3/5    External Anal Sphincter 2/5  3/5    Puborectalis 1/5  3/5    (Blank rows = not tested)        TONE: average  PROLAPSE: none  TODAY'S TREATMENT:  07/22/22 Manual: Internal pelvic floor techniques: No emotional/communication barriers or cognitive limitation. Patient is motivated to learn. Patient understands and agrees with treatment goals and plan. PT explains patient will be examined in standing, sitting, and lying down to see how their muscles and joints work. When they are ready, they will be asked to remove their underwear so PT can examine their perineum. The patient is also given  the option of providing their own chaperone as one is not provided in our facility. The patient also  has the right and is explained the right to defer or refuse any part of the evaluation or treatment including the internal exam. With the patient's consent, PT will use one gloved finger to gently assess the muscles of the pelvic floor, seeing how well it contracts and relaxes and if there is muscle symmetry. After, the patient will get dressed and PT and patient will discuss exam findings and plan of care. PT and patient discuss plan of care, schedule, attendance policy and HEP activities.  Going through the vagina working on the sides of the introitus, along the perineal body, and levator ani Neuromuscular re-education: Pelvic floor contraction training: Therapist finger in the vaginal canal working on pelvic floor contraction with tactile cues to assist with engagement of the pelvic floor muscles. Used 2 fingers to assist with the contraction Exercises: Strengthening: Bird dog in standing 10x and not lifting the left arm due to pain Sitting pelvic floor contraction with hands on abdomen to assist them for contraction 10 x  Single leg stance holding 10 sec 2 times each leg Tandem stance holding 30 sec 2 times each way 07/15/22 Exercises: Strengthening: Supine bridge with ball squeeze and pelvic floor contraction 5 x with good control Supine bridge with lifting her heels 10 x Supine march with yellow band around the knees with pelvic floor contraction 10 x  Leaning on counter top lifting one leg keep spinal neutral 10 x each leg Squat holding for 10 sec with pelvic floor and core contraction Nu step level 5 with verbal cues to not leg knees go inward for 6 minutes while assessing patient Standing holding a 5# wt and march Standing placing foot on BOSU ball and come back to standing    07/08/22 Manual: Soft tissue mobilization: Manual work to the left hamstring, left hip adductors, to elongate after Hunt needling To assess for Hunt needling Internal pelvic floor techniques: No  emotional/communication barriers or cognitive limitation. Patient is motivated to learn. Patient understands and agrees with treatment goals and plan. PT explains patient will be examined in standing, sitting, and lying down to see how their muscles and joints work. When they are ready, they will be asked to remove their underwear so PT can examine their perineum. The patient is also given the option of providing their own chaperone as one is not provided in our facility. The patient also has the right and is explained the right to defer or refuse any part of the evaluation or treatment including the internal exam. With the patient's consent, PT will use one gloved finger to gently assess the muscles of the pelvic floor, seeing how well it contracts and relaxes and if there is muscle symmetry. After, the patient will get dressed and PT and patient will discuss exam findings and plan of care. PT and patient discuss plan of care, schedule, attendance policy and HEP activities.  Going through the vaginal canal and working on the left levator ani, obturator internist, and perineal body Trigger Point Hunt-Needling  Treatment instructions: Expect mild to moderate muscle soreness. S/S of pneumothorax if Hunt needled over a lung field, and to seek immediate medical attention should they occur. Patient verbalized understanding of these instructions and education.  Patient Consent Given: Yes Education handout provided: Yes Muscles treated: left hip adductor,  Left hamstring Electrical stimulation performed: No Parameters: N/A Treatment response/outcome: elongation of muscle and trigger  point response  Neuromuscular re-education: Pelvic floor contraction training:  Therapist finger in the vaginal canal working on contraction with a lift then lower abdominal contraction Supine bridge with ball squeeze and pelvic floor contraction 15 x Supine hip isometric with ball squeeze, lower abdominal contraction and pelvic  floor contraction Supine march with ball between knees with ball squeeze and pelvic floor contraction 10 x each side but on the left side did not use the ball due to increased in left groin pain        PATIENT EDUCATION: 07/22/22 Education details: Access Code: [email or web address], lifting correctly Person educated: Patient Education method: Explanation, Demonstration, Tactile cues, Verbal cues, and Handouts Education comprehension: verbalized understanding, returned demonstration, verbal cues required, tactile cues required, and needs further education   HOME EXERCISE PROGRAM: 07/22/22 Access Code: [email or web address] URL: [email or web address] Date: 07/15/2022 Prepared by: Eulis Foster  Exercises - Supine Pelvic Floor Contraction  - 3 x daily - 7 x weekly - 1 sets - 10 reps - 5 sec hold - Supine Bridge  - 1 x daily - 7 x weekly - 1 sets - 10 reps - Supine March with Resistance Band  - 1 x daily - 7 x weekly - 2 sets - 10 reps - Bird Dog on Counter  - 1 x daily - 7 x weekly - 3 sets - 10 reps - Mini Squat with Chair  - 1 x daily - 7 x weekly - 1 sets - 10 reps - 5 sec hold  Patient Education - Trigger Point Hunt Needling    ASSESSMENT:  CLINICAL IMPRESSION: Patient is a 77 y.o. female who was seen today for physical therapy  treatment for stress incontinence. Patient reports her urinary leakage is 90% better. She is able to contract the pelvic floor with a lift when she has tactile cues to the muscles. She continues to the have the left hip pain. She still has a little fecal leakage.  Patient would benefit from skilled therapy to improve pelvic floor coordination and strength and reduce left hip pain.    OBJECTIVE IMPAIRMENTS: decreased activity tolerance, decreased coordination, decreased strength, increased fascial restrictions, increased muscle spasms, and pain.   ACTIVITY LIMITATIONS: carrying, lifting, squatting, continence, and locomotion level  PARTICIPATION LIMITATIONS:  cleaning, laundry, community activity, and yard work  PERSONAL FACTORS: Age, Time since onset of injury/illness/exacerbation, and 3+ comorbidities:    Lichen planus; osteopenia; divertivulitis; Left Total hip arthroplasty;  CYSTOURETHROSCOPY sacrospinous ligament suspension anterior repair 04/29/22 are also affecting patient's functional outcome.   REHAB POTENTIAL: Excellent  CLINICAL DECISION MAKING: Stable/uncomplicated  EVALUATION COMPLEXITY: Low   GOALS: Goals reviewed with patient? Yes  SHORT TERM GOALS: Target date: 07/18/22  Patient independent with initial HEP for pelvic floor strength.  Baseline: Goal status: Met 07/08/22  2.  Patient able to perform circular contraction of the vaginal canal and rectum due to reduction of restrictions of the muscles and fascia.  Baseline: can with the rectum and vaginally Goal status: Met 07/08/22  3.  Patient reports her left hip pain decreased >/= 25% due to decreased trigger points in the pelvic floor muscles.  Baseline:  Goal status: Met 07/15/22   LONG TERM GOALS: Target date: 09/13/22  Patient independent with advanced HEP for core, hip and pelvic floor to reduce leakage.  Baseline:  Goal status: INITIAL  2.  Vaginal strength increased >/= 3/5  so her urinary leakage with coughing and sneezing decreased >/= 75%.  Baseline: 3/5 but still has some leakage Goal  status: ongoing 07/22/22  3.  Rectal strength >/= 3/5 holding for 30 seconds to reduce fecal leakage with gas >/= 75%.  Baseline: 3/5 but some fecal leakage Goal status: ongoing 07/22/22  4.  Patient understands how to lift correctly to reduce strain on the pelvic floor.  Baseline:  Goal status: ongoing 07/22/22   PLAN:  PT FREQUENCY: 1x/week  PT DURATION: 12 weeks  PLANNED INTERVENTIONS: Therapeutic exercises, Therapeutic activity, Neuromuscular re-education, Patient/Family education, Joint mobilization, Hunt Needling, Electrical stimulation, Cryotherapy, Moist heat,  Taping, Ultrasound, Biofeedback, and Manual therapy  PLAN FOR NEXT SESSION: left hip flexion in standing keeping with ER, lifting, sit and stand core exercises  Eulis Foster, PT 07/22/22 4:12 PM

## 2022-08-05 ENCOUNTER — Encounter: Payer: Self-pay | Admitting: Physical Therapy

## 2022-08-05 ENCOUNTER — Ambulatory Visit: Payer: Medicare Other | Admitting: Physical Therapy

## 2022-08-05 DIAGNOSIS — M6281 Muscle weakness (generalized): Secondary | ICD-10-CM

## 2022-08-05 DIAGNOSIS — M25552 Pain in left hip: Secondary | ICD-10-CM

## 2022-08-05 DIAGNOSIS — R278 Other lack of coordination: Secondary | ICD-10-CM

## 2022-08-05 NOTE — Therapy (Signed)
OUTPATIENT PHYSICAL THERAPY FEMALE PELVIC TREATMENT   Patient Name: [name] MRN: [id number] DOB:1945-08-06, 77 y.o., female Today's Date: 08/05/2022  END OF SESSION:  PT End of Session - 08/05/22 1104     Visit Number 7    Date for PT Re-Evaluation 09/13/22    Authorization Type [employer] medicare    Authorization - Visit Number 7    Authorization - Number of Visits 10    PT Start Time 1100    PT Stop Time 1140    PT Time Calculation (min) 40 min    Activity Tolerance Patient tolerated treatment well    Behavior During Therapy [facility] for tasks assessed/performed             Past Medical History:  Diagnosis Date   Abnormal Pap smear 1995   [place]   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 being tested   Inflammatory polyps of colon with rectal bleeding (HCC) 12/96   Lichen planus    genital,gums,legs,back   Mitral valve prolapse    Osteopenia    Shingles 6/94   Shoulder fracture, left    Vitamin B12 deficiency 09/2016   Past Surgical History:  Procedure Laterality Date   COLONOSCOPY  04/08/2014   Polyps - repeat 5 years - Dr. Randa Evens    CYSTOURETHROSCOPY  Sacrospinous ligament suspension  Anterior repair  Cystoscopy   04/29/2022   NOSE SURGERY     nasal repair   SHOULDER ARTHROSCOPY Left 01/08/1996   TONSILLECTOMY AND ADENOIDECTOMY     as child   TOTAL HIP ARTHROPLASTY Left 01/08/2003   Patient Active Problem List   Diagnosis Date Noted   Pain in left hip 07/14/2019   Lichen planus 11/01/2016   Dyspnea 08/06/2012   Fatigue 08/06/2012   MVP (mitral valve prolapse) 08/06/2012   Female cystocele 04/01/2012   Rectocele 04/01/2012   Uterine prolapse 04/01/2012   Genuine stress incontinence, female 04/01/2012    PCP: Cleatis Polka., MD  REFERRING PROVIDER: Jerene Bears, MD     REFERRING DIAG: N39.3 (ICD-10-CM) - Genuine stress incontinence, female   THERAPY DIAG:  Muscle  weakness (generalized)  Other lack of coordination  Pain in left hip  Rationale for Evaluation and Treatment: Rehabilitation  ONSET DATE: 04/29/22  SUBJECTIVE:                                                                                                                                                                                           SUBJECTIVE STATEMENT: I  feel stronger. I am contracting my abdominals better. I am trying to do more walking. My doctor is concerned about me losing weight.   PAIN:  Are you having pain? Yes: NPRS scale: 4-8/10 Pain location: left hip Pain description: achy, sharp, intermittent Aggravating factors: cough, sneeze, increased activity Relieving factors: nothing    PRECAUTIONS: left posterior hip replacement  WEIGHT BEARING RESTRICTIONS: No  FALLS:  Has patient fallen in last 6 months? No  LIVING ENVIRONMENT: Lives with: lives alone  OCCUPATION: retired, no exercise  PLOF: Independent  PATIENT GOALS: reduce urinary leakage and anal leakage, how to lift properly  PERTINENT HISTORY:  Lichen planus; osteopenia; divertivulitis; Left Total hip arthroplasty;  CYSTOURETHROSCOPY sacrospinous ligament suspension anterior repair Sacrospinous ligament suspension  Anterior repair  Cystoscopy 04/29/22   BOWEL MOVEMENT: Pain with bowel movement: No Type of bowel movement:Type (Bristol Stool Scale) Type 4, Frequency daily, Strain No, and Splinting no ; every 3-4 weeks has severe diarrhea and comes on suddenly and possible cramps in the middle of the day Fully empty rectum: No, l Leakage: Yes:   Pads: Yes: panty liner Fiber supplement: No, eats a lot of fiber  URINATION: Pain with urination: No Fully empty bladder: No wait after urination and then post void dribble Stream: Strong Urgency: Yes: less since surgery Frequency: every 2-3 hours Leakage: Coughing, Lifting, and possible gardening Pads: Yes: pantyliner  INTERCOURSE: not  sexually active   PREGNANCY: Vaginal deliveries 1 Tearing No   PROLAPSE: Corrected with surgery   OBJECTIVE:   DIAGNOSTIC FINDINGS:  none   COGNITION: Overall cognitive status: Within functional limits for tasks assessed     SENSATION: Light touch: Appears intact Proprioception: Appears intact   POSTURE: No Significant postural limitations  PELVIC ALIGNMENT: correct alignment  LUMBARAROM/PROM: full lumbar ROM    LOWER EXTREMITY MMT:  MMT Right eval Left eval  Hip flexion 5/5 4/5 with pain  Hip extension 4/5 with pain in left hip 5/5  Hip abduction 4/5 3+/5 with pain  Hip adduction 5/5 5/5   PALPATION:   General  tender in left inguinal area, along the left iliopsoas, and left upper quadrant                External Perineal Exam intact                             Internal Pelvic Floor tenderness located on the levator ani and obturator internist, tightness of the anococcygeal ligament  Patient confirms identification and approves PT to assess internal pelvic floor and treatment Yes  PELVIC MMT:   MMT eval 06/24/22 07/03/22 07/08/22 07/22/22  Vaginal 2/5 except for left side that is 1/5 2/5 circular contraction   2/5 3 times and 3/5 4 times 3/5  Internal Anal Sphincter 2/5  3/5    External Anal Sphincter 2/5  3/5    Puborectalis 1/5  3/5    (Blank rows = not tested)        TONE: average  PROLAPSE: none  TODAY'S TREATMENT:  08/05/22 Exercises: Strengthening: Nu step level 5 with verbal cues to not leg knees go inward for 6 minutes while assessing patient Bridge with ball squeeze alternate heel lift 15 x Supine march with yellow band around the knees with pelvic floor contraction 10 x  Leaning on counter top lifting one leg keep spinal neutral 10 x each leg and yellow band around legs Squat holding for 10 sec with pelvic  floor and core contraction holding ball between hands at shoulder height, yellow band around knees Tandem stance holding 30 sec 2  times each way with head movements and pelvic floor contraction Therapeutic activities: Functional strengthening activities:  07/22/22 Manual: Internal pelvic floor techniques: No emotional/communication barriers or cognitive limitation. Patient is motivated to learn. Patient understands and agrees with treatment goals and plan. PT explains patient will be examined in standing, sitting, and lying down to see how their muscles and joints work. When they are ready, they will be asked to remove their underwear so PT can examine their perineum. The patient is also given the option of providing their own chaperone as one is not provided in our facility. The patient also has the right and is explained the right to defer or refuse any part of the evaluation or treatment including the internal exam. With the patient's consent, PT will use one gloved finger to gently assess the muscles of the pelvic floor, seeing how well it contracts and relaxes and if there is muscle symmetry. After, the patient will get dressed and PT and patient will discuss exam findings and plan of care. PT and patient discuss plan of care, schedule, attendance policy and HEP activities.  Going through the vagina working on the sides of the introitus, along the perineal body, and levator ani Neuromuscular re-education: Pelvic floor contraction training: Therapist finger in the vaginal canal working on pelvic floor contraction with tactile cues to assist with engagement of the pelvic floor muscles. Used 2 fingers to assist with the contraction Exercises: Strengthening: Bird dog in standing 10x and not lifting the left arm due to pain Sitting pelvic floor contraction with hands on abdomen to assist them for contraction 10 x  Single leg stance holding 10 sec 2 times each leg Tandem stance holding 30 sec 2 times each way 07/15/22 Exercises: Strengthening: Supine bridge with ball squeeze and pelvic floor contraction 5 x with good  control Supine bridge with lifting her heels 10 x Supine march with yellow band around the knees with pelvic floor contraction 10 x  Leaning on counter top lifting one leg keep spinal neutral 10 x each leg Squat holding for 10 sec with pelvic floor and core contraction Nu step level 5 with verbal cues to not leg knees go inward for 6 minutes while assessing patient Standing holding a 5# wt and march Standing placing foot on BOSU ball and come back to standing        PATIENT EDUCATION: 08/05/22 Education details: Access Code: [email or web address], lifting correctly Person educated: Patient Education method: Explanation, Demonstration, Tactile cues, Verbal cues, and Handouts Education comprehension: verbalized understanding, returned demonstration, verbal cues required, tactile cues required, and needs further education   HOME EXERCISE PROGRAM: 08/05/22 Access Code: [email or web address] URL: [email or web address] Date: 08/05/2022 Prepared by: Eulis Foster  Exercises - Supine Pelvic Floor Contraction  - 3 x daily - 7 x weekly - 1 sets - 10 reps - 5 sec hold - Supine March with Resistance Band  - 1 x daily - 7 x weekly - 2 sets - 10 reps - Bird Dog on Counter  - 1 x daily - 7 x weekly - 2 sets - 10 reps - Mini Squat with Chair  - 1 x daily - 7 x weekly - 1 sets - 10 reps - 5 sec hold - Seated Pelvic Floor Contraction  - 1 x daily - 7 x weekly - 1 sets - 10 reps - 5 sec hold -  Single Leg Stance  - 1 x daily - 1 x weekly - 1 sets - 3 reps - 10 sec hold - Tandem Stance  - 1 x daily - 1 x weekly - 1 sets - 2 reps - 30 sec hold - Supine Bridge with Mini Swiss Ball Between Knees  - 1 x daily - 7 x weekly - 1 sets - 15 reps    ASSESSMENT:  CLINICAL IMPRESSION: Patient is a 77 y.o. female who was seen today for physical therapy  treatment for stress incontinence. Patient reports her urinary leakage is 90% better. Sas progressed her home program to advance with reduction of urinary leakage.  She  still has a little fecal leakage.  Patient would benefit from skilled therapy to improve pelvic floor coordination and strength and reduce left hip pain.    OBJECTIVE IMPAIRMENTS: decreased activity tolerance, decreased coordination, decreased strength, increased fascial restrictions, increased muscle spasms, and pain.   ACTIVITY LIMITATIONS: carrying, lifting, squatting, continence, and locomotion level  PARTICIPATION LIMITATIONS: cleaning, laundry, community activity, and yard work  PERSONAL FACTORS: Age, Time since onset of injury/illness/exacerbation, and 3+ comorbidities:    Lichen planus; osteopenia; divertivulitis; Left Total hip arthroplasty;  CYSTOURETHROSCOPY sacrospinous ligament suspension anterior repair 04/29/22 are also affecting patient's functional outcome.   REHAB POTENTIAL: Excellent  CLINICAL DECISION MAKING: Stable/uncomplicated  EVALUATION COMPLEXITY: Low   GOALS: Goals reviewed with patient? Yes  SHORT TERM GOALS: Target date: 07/18/22  Patient independent with initial HEP for pelvic floor strength.  Baseline: Goal status: Met 07/08/22  2.  Patient able to perform circular contraction of the vaginal canal and rectum due to reduction of restrictions of the muscles and fascia.  Baseline: can with the rectum and vaginally Goal status: Met 07/08/22  3.  Patient reports her left hip pain decreased >/= 25% due to decreased trigger points in the pelvic floor muscles.  Baseline:  Goal status: Met 07/15/22   LONG TERM GOALS: Target date: 09/13/22  Patient independent with advanced HEP for core, hip and pelvic floor to reduce leakage.  Baseline:  Goal status: INITIAL  2.  Vaginal strength increased >/= 3/5  so her urinary leakage with coughing and sneezing decreased >/= 75%.  Baseline: 3/5 but still has some leakage Goal status: ongoing 07/22/22  3.  Rectal strength >/= 3/5 holding for 30 seconds to reduce fecal leakage with gas >/= 75%.  Baseline: 3/5 but some fecal  leakage Goal status: ongoing 07/22/22  4.  Patient understands how to lift correctly to reduce strain on the pelvic floor.  Baseline:  Goal status: ongoing 07/22/22   PLAN:  PT FREQUENCY: 1x/week  PT DURATION: 12 weeks  PLANNED INTERVENTIONS: Therapeutic exercises, Therapeutic activity, Neuromuscular re-education, Patient/Family education, Joint mobilization, Dry Needling, Electrical stimulation, Cryotherapy, Moist heat, Taping, Ultrasound, Biofeedback, and Manual therapy  PLAN FOR NEXT SESSION: up date HEP  Eulis Foster, PT 08/05/22 11:42 AM

## 2022-08-12 ENCOUNTER — Ambulatory Visit (HOSPITAL_BASED_OUTPATIENT_CLINIC_OR_DEPARTMENT_OTHER): Payer: Medicare Other | Admitting: Obstetrics & Gynecology

## 2022-08-21 ENCOUNTER — Ambulatory Visit: Payer: Medicare Other | Attending: Obstetrics & Gynecology | Admitting: Physical Therapy

## 2022-08-21 ENCOUNTER — Encounter: Payer: Self-pay | Admitting: Physical Therapy

## 2022-08-21 DIAGNOSIS — M6281 Muscle weakness (generalized): Secondary | ICD-10-CM

## 2022-08-21 DIAGNOSIS — M25552 Pain in left hip: Secondary | ICD-10-CM | POA: Diagnosis present

## 2022-08-21 DIAGNOSIS — R278 Other lack of coordination: Secondary | ICD-10-CM

## 2022-08-21 NOTE — Therapy (Signed)
OUTPATIENT PHYSICAL THERAPY FEMALE PELVIC TREATMENT   Patient Name: [name] MRN: [id number] DOB:January 03, 1946, 77 y.o., female Today's Date: 08/21/2022  END OF SESSION:  PT End of Session - 08/21/22 0848     Visit Number 8    Date for PT Re-Evaluation 09/13/22    Authorization Type [employer] medicare    Authorization - Visit Number 8    Authorization - Number of Visits 10    PT Start Time 0845    PT Stop Time 0925    PT Time Calculation (min) 40 min    Activity Tolerance Patient tolerated treatment well    Behavior During Therapy [facility] for tasks assessed/performed             Past Medical History:  Diagnosis Date   Abnormal Pap smear 1995   [place]   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 being tested   Inflammatory polyps of colon with rectal bleeding (HCC) 12/96   Lichen planus    genital,gums,legs,back   Mitral valve prolapse    Osteopenia    Shingles 6/94   Shoulder fracture, left    Vitamin B12 deficiency 09/2016   Past Surgical History:  Procedure Laterality Date   COLONOSCOPY  04/08/2014   Polyps - repeat 5 years - Dr. Randa Evens    CYSTOURETHROSCOPY  Sacrospinous ligament suspension  Anterior repair  Cystoscopy   04/29/2022   NOSE SURGERY     nasal repair   SHOULDER ARTHROSCOPY Left 01/08/1996   TONSILLECTOMY AND ADENOIDECTOMY     as child   TOTAL HIP ARTHROPLASTY Left 01/08/2003   Patient Active Problem List   Diagnosis Date Noted   Pain in left hip 07/14/2019   Lichen planus 11/01/2016   Dyspnea 08/06/2012   Fatigue 08/06/2012   MVP (mitral valve prolapse) 08/06/2012   Female cystocele 04/01/2012   Rectocele 04/01/2012   Uterine prolapse 04/01/2012   Genuine stress incontinence, female 04/01/2012    PCP: Cleatis Polka., MD  REFERRING PROVIDER: Jerene Bears, MD     REFERRING DIAG: N39.3 (ICD-10-CM) - Genuine stress incontinence, female   THERAPY DIAG:  Muscle  weakness (generalized)  Other lack of coordination  Pain in left hip  Rationale for Evaluation and Treatment: Rehabilitation  ONSET DATE: 04/29/22  SUBJECTIVE:                                                                                                                                                                                           SUBJECTIVE STATEMENT: I  am feeling better. [facility]     PAIN:  Are you having pain? Yes: NPRS scale: 4-8/10 Pain location: left hip Pain description: achy, sharp, intermittent Aggravating factors: cough, sneeze, increased activity Relieving factors: nothing    PRECAUTIONS: left posterior hip replacement  WEIGHT BEARING RESTRICTIONS: No  FALLS:  Has patient fallen in last 6 months? No  LIVING ENVIRONMENT: Lives with: lives alone  OCCUPATION: retired, no exercise  PLOF: Independent  PATIENT GOALS: reduce urinary leakage and anal leakage, how to lift properly  PERTINENT HISTORY:  Lichen planus; osteopenia; divertivulitis; Left Total hip arthroplasty;  CYSTOURETHROSCOPY sacrospinous ligament suspension anterior repair Sacrospinous ligament suspension  Anterior repair  Cystoscopy 04/29/22   BOWEL MOVEMENT: Pain with bowel movement: No Type of bowel movement:Type (Bristol Stool Scale) Type 4, Frequency daily, Strain No, and Splinting no ; every 3-4 weeks has severe diarrhea and comes on suddenly and possible cramps in the middle of the day Fully empty rectum: No, l Leakage: Yes:   08/21/22 1-2 times per week.  Pads: Yes: panty liner Fiber supplement: No, eats a lot of fiber  URINATION: Pain with urination: No Fully empty bladder: No wait after urination and then post void dribble Stream: Strong Urgency: Yes: less since surgery Frequency: every 2-3 hours Leakage: Coughing, Lifting, and possible gardening 08/21/22 no urinary leakage Pads: Yes: pantyliner  INTERCOURSE: not sexually  active   PREGNANCY: Vaginal deliveries 1 Tearing No   PROLAPSE: Corrected with surgery   OBJECTIVE:   DIAGNOSTIC FINDINGS:  none   COGNITION: Overall cognitive status: Within functional limits for tasks assessed     SENSATION: Light touch: Appears intact Proprioception: Appears intact   POSTURE: No Significant postural limitations  PELVIC ALIGNMENT: correct alignment  LUMBARAROM/PROM: full lumbar ROM    LOWER EXTREMITY MMT:  MMT Right eval Left eval Right  08/21/22 Left 08/21/22  Hip flexion 5/5 4/5 with pain 5/5 5/5 pain  Hip extension 4/5 with pain in left hip 5/5 5/5 5/5  Hip abduction 4/5 3+/5 with pain 5/5 4/5  Hip adduction 5/5 5/5 5/5 5/5   PALPATION:   General  tender in left inguinal area, along the left iliopsoas, and left upper quadrant                External Perineal Exam intact                             Internal Pelvic Floor tenderness located on the levator ani and obturator internist, tightness of the anococcygeal ligament  Patient confirms identification and approves PT to assess internal pelvic floor and treatment Yes  PELVIC MMT:   MMT eval 06/24/22 07/03/22 07/08/22 07/22/22  Vaginal 2/5 except for left side that is 1/5 2/5 circular contraction   2/5 3 times and 3/5 4 times 3/5  Internal Anal Sphincter 2/5  3/5    External Anal Sphincter 2/5  3/5    Puborectalis 1/5  3/5    (Blank rows = not tested)        TONE: average  PROLAPSE: none  TODAY'S TREATMENT:  08/21/22  Exercises: Stretches/mobility: Strengthening: Nu step level 5 with verbal cues to not leg knees go inward for 6 minutes while assessing patient Tandem stance holding 30 sec 2 times each way with head movements and pelvic floor contraction Single leg stance with pelvic floor and lower abdominal contraction  Squat holding for 10  sec with pelvic floor and core contraction holding ball between hands at shoulder height, yellow band around knees Leaning on counter  top lifting one leg keep spinal neutral 10 x each leg and yellow band around legs Bridge with ball squeeze alternate heel lift 15 x Supine march with yellow band around the knees with pelvic floor contraction 10 x     08/05/22 Exercises: Strengthening: Nu step level 5 with verbal cues to not leg knees go inward for 6 minutes while assessing patient Bridge with ball squeeze alternate heel lift 15 x Supine march with yellow band around the knees with pelvic floor contraction 10 x  Leaning on counter top lifting one leg keep spinal neutral 10 x each leg and yellow band around legs Squat holding for 10 sec with pelvic floor and core contraction holding ball between hands at shoulder height, yellow band around knees Tandem stance holding 30 sec 2 times each way with head movements and pelvic floor contraction  07/22/22 Manual: Internal pelvic floor techniques: No emotional/communication barriers or cognitive limitation. Patient is motivated to learn. Patient understands and agrees with treatment goals and plan. PT explains patient will be examined in standing, sitting, and lying down to see how their muscles and joints work. When they are ready, they will be asked to remove their underwear so PT can examine their perineum. The patient is also given the option of providing their own chaperone as one is not provided in our facility. The patient also has the right and is explained the right to defer or refuse any part of the evaluation or treatment including the internal exam. With the patient's consent, PT will use one gloved finger to gently assess the muscles of the pelvic floor, seeing how well it contracts and relaxes and if there is muscle symmetry. After, the patient will get dressed and PT and patient will discuss exam findings and plan of care. PT and patient discuss plan of care, schedule, attendance policy and HEP activities.  Going through the vagina working on the sides of the introitus, along  the perineal body, and levator ani Neuromuscular re-education: Pelvic floor contraction training: Therapist finger in the vaginal canal working on pelvic floor contraction with tactile cues to assist with engagement of the pelvic floor muscles. Used 2 fingers to assist with the contraction Exercises: Strengthening: Bird dog in standing 10x and not lifting the left arm due to pain Sitting pelvic floor contraction with hands on abdomen to assist them for contraction 10 x  Single leg stance holding 10 sec 2 times each leg Tandem stance holding 30 sec 2 times each way    PATIENT EDUCATION: 08/21/22 Education details: Access Code: [email or web address], lifting correctly Person educated: Patient Education method: Explanation, Demonstration, Tactile cues, Verbal cues, and Handouts Education comprehension: verbalized understanding, returned demonstration, verbal cues required, tactile cues required, and needs further education   HOME EXERCISE PROGRAM: 08/21/22 Access Code: [email or web address] URL: [email or web address] Date: 08/05/2022 Prepared by: Eulis Foster  Exercises - Supine Pelvic Floor Contraction  - 3 x daily - 7 x weekly - 1 sets - 10 reps - 5 sec hold - Supine March with Resistance Band  - 1 x daily - 7 x weekly - 2 sets - 10 reps - Bird Dog on Counter  - 1 x daily - 7 x weekly - 2 sets - 10 reps - Mini Squat with Chair  - 1 x daily - 7 x weekly - 1 sets - 10 reps - 5 sec hold -  Seated Pelvic Floor Contraction  - 1 x daily - 7 x weekly - 1 sets - 10 reps - 5 sec hold - Single Leg Stance  - 1 x daily - 1 x weekly - 1 sets - 3 reps - 10 sec hold - Tandem Stance  - 1 x daily - 1 x weekly - 1 sets - 2 reps - 30 sec hold - Supine Bridge with Mini Swiss Ball Between Knees  - 1 x daily - 7 x weekly - 1 sets - 15 reps    ASSESSMENT:  CLINICAL IMPRESSION: Patient is a 77 y.o. female who was seen today for physical therapy  treatment for stress incontinence. Patient is not having urinary  leakage. She will have a fecal stain on her underwear 1-2 times per week. She understands how lift correctly to reduce the strain on the pelvic floor. She has increased in pelvic floor strength to 3/5. She has increased strength in both hips. Patient is independent with her HEP.    OBJECTIVE IMPAIRMENTS: decreased activity tolerance, decreased coordination, decreased strength, increased fascial restrictions, increased muscle spasms, and pain.   ACTIVITY LIMITATIONS: carrying, lifting, squatting, continence, and locomotion level  PARTICIPATION LIMITATIONS: cleaning, laundry, community activity, and yard work  PERSONAL FACTORS: Age, Time since onset of injury/illness/exacerbation, and 3+ comorbidities:    Lichen planus; osteopenia; divertivulitis; Left Total hip arthroplasty;  CYSTOURETHROSCOPY sacrospinous ligament suspension anterior repair 04/29/22 are also affecting patient's functional outcome.   REHAB POTENTIAL: Excellent  CLINICAL DECISION MAKING: Stable/uncomplicated  EVALUATION COMPLEXITY: Low   GOALS: Goals reviewed with patient? Yes  SHORT TERM GOALS: Target date: 07/18/22  Patient independent with initial HEP for pelvic floor strength.  Baseline: Goal status: Met 07/08/22  2.  Patient able to perform circular contraction of the vaginal canal and rectum due to reduction of restrictions of the muscles and fascia.  Baseline: can with the rectum and vaginally Goal status: Met 07/08/22  3.  Patient reports her left hip pain decreased >/= 25% due to decreased trigger points in the pelvic floor muscles.  Baseline:  Goal status: Met 07/15/22   LONG TERM GOALS: Target date: 09/13/22  Patient independent with advanced HEP for core, hip and pelvic floor to reduce leakage.  Baseline:  Goal status: Met 08/21/22  2.  Vaginal strength increased >/= 3/5  so her urinary leakage with coughing and sneezing decreased >/= 75%.  Baseline: 3/5 and no leakage Goal status: Met 08/21/22  3.  Rectal  strength >/= 3/5 holding for 30 seconds to reduce fecal leakage with gas >/= 75%.  Baseline: 3/5 but some fecal leakage Goal status: Partially met /24  4.  Patient understands how to lift correctly to reduce strain on the pelvic floor.  Baseline:  Goal status: Met 08/21/22   PLAN: discharge to HEP   Eulis Foster, PT 08/21/22 9:31 AM  PHYSICAL THERAPY DISCHARGE SUMMARY  Visits from Start of Care: 8  Current functional level related to goals / functional outcomes: See above. Patient will have a fecal mark on her underwear 1-2 times per week.    Remaining deficits: See above.    Education / Equipment: HEP   Patient agrees to discharge. Patient goals were met. Patient is being discharged due to meeting the stated rehab goals. Thank you for the referral. Eulis Foster, PT 08/21/22 9:31 AM

## 2022-08-23 ENCOUNTER — Ambulatory Visit (HOSPITAL_BASED_OUTPATIENT_CLINIC_OR_DEPARTMENT_OTHER): Payer: Medicare Other | Admitting: Obstetrics & Gynecology

## 2022-08-28 ENCOUNTER — Encounter: Payer: No Typology Code available for payment source | Admitting: Physical Therapy

## 2022-09-04 ENCOUNTER — Encounter: Payer: No Typology Code available for payment source | Admitting: Physical Therapy

## 2022-09-11 ENCOUNTER — Encounter: Payer: No Typology Code available for payment source | Admitting: Physical Therapy

## 2022-09-18 ENCOUNTER — Encounter: Payer: No Typology Code available for payment source | Admitting: Physical Therapy

## 2022-09-25 ENCOUNTER — Encounter: Payer: No Typology Code available for payment source | Admitting: Physical Therapy

## 2023-07-25 ENCOUNTER — Encounter: Payer: Self-pay | Admitting: Advanced Practice Midwife
